# Patient Record
Sex: Female | Born: 1965 | Race: Black or African American | Hispanic: No | Marital: Married | Smoking: Never smoker
Health system: Southern US, Community
[De-identification: ages and names within clinical notes are randomized; demographics above are authoritative.]

## PROBLEM LIST (undated history)

## (undated) ENCOUNTER — Inpatient Hospital Stay (HOSPITAL_COMMUNITY): Payer: Self-pay

## (undated) DIAGNOSIS — K9189 Other postprocedural complications and disorders of digestive system: Secondary | ICD-10-CM

## (undated) DIAGNOSIS — D219 Benign neoplasm of connective and other soft tissue, unspecified: Secondary | ICD-10-CM

## (undated) DIAGNOSIS — D696 Thrombocytopenia, unspecified: Secondary | ICD-10-CM

## (undated) DIAGNOSIS — N870 Mild cervical dysplasia: Secondary | ICD-10-CM

## (undated) DIAGNOSIS — N83209 Unspecified ovarian cyst, unspecified side: Secondary | ICD-10-CM

## (undated) DIAGNOSIS — E7800 Pure hypercholesterolemia, unspecified: Secondary | ICD-10-CM

## (undated) DIAGNOSIS — K429 Umbilical hernia without obstruction or gangrene: Secondary | ICD-10-CM

## (undated) DIAGNOSIS — I10 Essential (primary) hypertension: Secondary | ICD-10-CM

## (undated) DIAGNOSIS — K567 Ileus, unspecified: Secondary | ICD-10-CM

## (undated) HISTORY — DX: Benign neoplasm of connective and other soft tissue, unspecified: D21.9

## (undated) HISTORY — PX: ABDOMINAL EXPLORATION SURGERY: SHX538

## (undated) HISTORY — PX: OTHER SURGICAL HISTORY: SHX169

## (undated) HISTORY — DX: Unspecified ovarian cyst, unspecified side: N83.209

## (undated) HISTORY — DX: Mild cervical dysplasia: N87.0

## (undated) HISTORY — PX: COLPOSCOPY: SHX161

## (undated) HISTORY — DX: Pure hypercholesterolemia, unspecified: E78.00

---

## 1997-12-14 ENCOUNTER — Other Ambulatory Visit: Admission: RE | Admit: 1997-12-14 | Discharge: 1997-12-14 | Payer: Self-pay | Admitting: Obstetrics

## 1999-03-09 ENCOUNTER — Other Ambulatory Visit: Admission: RE | Admit: 1999-03-09 | Discharge: 1999-03-09 | Payer: Self-pay | Admitting: *Deleted

## 2000-06-03 ENCOUNTER — Other Ambulatory Visit: Admission: RE | Admit: 2000-06-03 | Discharge: 2000-06-03 | Payer: Self-pay | Admitting: Obstetrics and Gynecology

## 2001-02-12 ENCOUNTER — Inpatient Hospital Stay (HOSPITAL_COMMUNITY): Admission: AD | Admit: 2001-02-12 | Discharge: 2001-02-12 | Payer: Self-pay | Admitting: *Deleted

## 2001-05-06 ENCOUNTER — Encounter: Payer: Self-pay | Admitting: *Deleted

## 2001-05-06 ENCOUNTER — Encounter (INDEPENDENT_AMBULATORY_CARE_PROVIDER_SITE_OTHER): Payer: Self-pay

## 2001-05-06 ENCOUNTER — Inpatient Hospital Stay (HOSPITAL_COMMUNITY): Admission: AD | Admit: 2001-05-06 | Discharge: 2001-05-13 | Payer: Self-pay | Admitting: *Deleted

## 2002-05-06 ENCOUNTER — Other Ambulatory Visit: Admission: RE | Admit: 2002-05-06 | Discharge: 2002-05-06 | Payer: Self-pay | Admitting: *Deleted

## 2002-06-10 ENCOUNTER — Encounter (HOSPITAL_COMMUNITY): Admission: AD | Admit: 2002-06-10 | Discharge: 2002-07-10 | Payer: Self-pay | Admitting: *Deleted

## 2002-07-15 ENCOUNTER — Encounter (HOSPITAL_COMMUNITY): Admission: AD | Admit: 2002-07-15 | Discharge: 2002-08-14 | Payer: Self-pay | Admitting: *Deleted

## 2002-08-19 ENCOUNTER — Encounter (HOSPITAL_COMMUNITY): Admission: AD | Admit: 2002-08-19 | Discharge: 2002-09-18 | Payer: Self-pay | Admitting: *Deleted

## 2002-09-23 ENCOUNTER — Encounter (HOSPITAL_COMMUNITY): Admission: RE | Admit: 2002-09-23 | Discharge: 2002-10-07 | Payer: Self-pay | Admitting: *Deleted

## 2002-10-14 ENCOUNTER — Encounter (HOSPITAL_COMMUNITY): Admission: AD | Admit: 2002-10-14 | Discharge: 2002-11-13 | Payer: Self-pay | Admitting: *Deleted

## 2002-10-18 ENCOUNTER — Inpatient Hospital Stay (HOSPITAL_COMMUNITY): Admission: AD | Admit: 2002-10-18 | Discharge: 2002-10-18 | Payer: Self-pay | Admitting: *Deleted

## 2002-11-20 ENCOUNTER — Inpatient Hospital Stay (HOSPITAL_COMMUNITY): Admission: AD | Admit: 2002-11-20 | Discharge: 2002-11-20 | Payer: Self-pay | Admitting: Obstetrics and Gynecology

## 2002-11-20 ENCOUNTER — Inpatient Hospital Stay (HOSPITAL_COMMUNITY): Admission: AD | Admit: 2002-11-20 | Discharge: 2002-11-23 | Payer: Self-pay | Admitting: *Deleted

## 2003-06-16 ENCOUNTER — Other Ambulatory Visit: Admission: RE | Admit: 2003-06-16 | Discharge: 2003-06-16 | Payer: Self-pay | Admitting: *Deleted

## 2006-01-24 ENCOUNTER — Emergency Department (HOSPITAL_COMMUNITY): Admission: EM | Admit: 2006-01-24 | Discharge: 2006-01-25 | Payer: Self-pay | Admitting: Emergency Medicine

## 2006-01-24 ENCOUNTER — Emergency Department (HOSPITAL_COMMUNITY): Admission: EM | Admit: 2006-01-24 | Discharge: 2006-01-24 | Payer: Self-pay | Admitting: Family Medicine

## 2006-03-11 ENCOUNTER — Emergency Department (HOSPITAL_COMMUNITY): Admission: EM | Admit: 2006-03-11 | Discharge: 2006-03-11 | Payer: Self-pay | Admitting: Family Medicine

## 2006-04-16 ENCOUNTER — Other Ambulatory Visit: Admission: RE | Admit: 2006-04-16 | Discharge: 2006-04-16 | Payer: Self-pay | Admitting: Obstetrics and Gynecology

## 2006-10-07 ENCOUNTER — Ambulatory Visit (HOSPITAL_COMMUNITY): Admission: RE | Admit: 2006-10-07 | Discharge: 2006-10-07 | Payer: Self-pay | Admitting: Obstetrics and Gynecology

## 2006-10-24 ENCOUNTER — Inpatient Hospital Stay (HOSPITAL_COMMUNITY): Admission: AD | Admit: 2006-10-24 | Discharge: 2006-10-27 | Payer: Self-pay | Admitting: Obstetrics and Gynecology

## 2006-10-30 ENCOUNTER — Inpatient Hospital Stay (HOSPITAL_COMMUNITY): Admission: AD | Admit: 2006-10-30 | Discharge: 2006-10-30 | Payer: Self-pay | Admitting: Obstetrics and Gynecology

## 2006-11-03 ENCOUNTER — Emergency Department (HOSPITAL_COMMUNITY): Admission: EM | Admit: 2006-11-03 | Discharge: 2006-11-03 | Payer: Self-pay | Admitting: Emergency Medicine

## 2006-11-03 ENCOUNTER — Inpatient Hospital Stay (HOSPITAL_COMMUNITY): Admission: AD | Admit: 2006-11-03 | Discharge: 2006-11-03 | Payer: Self-pay | Admitting: Obstetrics and Gynecology

## 2006-11-04 ENCOUNTER — Inpatient Hospital Stay (HOSPITAL_COMMUNITY): Admission: AD | Admit: 2006-11-04 | Discharge: 2006-11-04 | Payer: Self-pay | Admitting: Obstetrics and Gynecology

## 2006-12-08 ENCOUNTER — Inpatient Hospital Stay (HOSPITAL_COMMUNITY): Admission: AD | Admit: 2006-12-08 | Discharge: 2006-12-10 | Payer: Self-pay | Admitting: Obstetrics and Gynecology

## 2006-12-09 ENCOUNTER — Encounter: Payer: Self-pay | Admitting: Obstetrics and Gynecology

## 2006-12-19 ENCOUNTER — Ambulatory Visit (HOSPITAL_COMMUNITY): Admission: RE | Admit: 2006-12-19 | Discharge: 2006-12-19 | Payer: Self-pay | Admitting: Obstetrics and Gynecology

## 2007-01-02 ENCOUNTER — Encounter (INDEPENDENT_AMBULATORY_CARE_PROVIDER_SITE_OTHER): Payer: Self-pay | Admitting: Specialist

## 2007-01-02 ENCOUNTER — Inpatient Hospital Stay (HOSPITAL_COMMUNITY): Admission: AD | Admit: 2007-01-02 | Discharge: 2007-01-06 | Payer: Self-pay | Admitting: Obstetrics and Gynecology

## 2007-02-13 ENCOUNTER — Other Ambulatory Visit: Admission: RE | Admit: 2007-02-13 | Discharge: 2007-02-13 | Payer: Self-pay | Admitting: Obstetrics and Gynecology

## 2008-01-16 ENCOUNTER — Encounter: Admission: RE | Admit: 2008-01-16 | Discharge: 2008-01-16 | Payer: Self-pay | Admitting: Family Medicine

## 2008-01-27 ENCOUNTER — Encounter: Admission: RE | Admit: 2008-01-27 | Discharge: 2008-01-27 | Payer: Self-pay | Admitting: Family Medicine

## 2008-05-25 ENCOUNTER — Encounter: Payer: Self-pay | Admitting: Obstetrics and Gynecology

## 2008-05-25 ENCOUNTER — Ambulatory Visit: Payer: Self-pay | Admitting: Obstetrics and Gynecology

## 2008-05-25 ENCOUNTER — Other Ambulatory Visit: Admission: RE | Admit: 2008-05-25 | Discharge: 2008-05-25 | Payer: Self-pay | Admitting: Obstetrics and Gynecology

## 2008-06-23 ENCOUNTER — Ambulatory Visit: Payer: Self-pay | Admitting: Obstetrics and Gynecology

## 2009-03-23 ENCOUNTER — Other Ambulatory Visit: Admission: RE | Admit: 2009-03-23 | Discharge: 2009-03-23 | Payer: Self-pay | Admitting: Obstetrics and Gynecology

## 2009-03-23 ENCOUNTER — Ambulatory Visit: Payer: Self-pay | Admitting: Obstetrics and Gynecology

## 2009-03-23 ENCOUNTER — Encounter: Payer: Self-pay | Admitting: Obstetrics and Gynecology

## 2009-09-05 ENCOUNTER — Ambulatory Visit: Payer: Self-pay | Admitting: Obstetrics and Gynecology

## 2009-09-08 ENCOUNTER — Ambulatory Visit: Payer: Self-pay | Admitting: Obstetrics and Gynecology

## 2010-06-14 ENCOUNTER — Other Ambulatory Visit: Admission: RE | Admit: 2010-06-14 | Discharge: 2010-06-14 | Payer: Self-pay | Admitting: Obstetrics and Gynecology

## 2010-06-14 ENCOUNTER — Ambulatory Visit: Payer: Self-pay | Admitting: Obstetrics and Gynecology

## 2010-07-05 ENCOUNTER — Encounter: Admission: RE | Admit: 2010-07-05 | Discharge: 2010-07-05 | Payer: Self-pay | Admitting: Neurology

## 2011-02-09 NOTE — Discharge Summary (Signed)
   NAMEMCKENLEE, MANGHAM                          ACCOUNT NO.:  [id number]   MEDICAL RECORD NO.:  [id number]                   PATIENT TYPE:  INP   LOCATION:  9111                                 FACILITY:  [facility]   PHYSICIAN:  Georgina Peer, M.D.              DATE OF BIRTH:  [birth year]   DATE OF ADMISSION:  11/20/2002  DATE OF DISCHARGE:  11/23/2002                                 DISCHARGE SUMMARY   ADMISSION DIAGNOSES:  1. Intrauterine pregnancy at 38 weeks.  2. Previous classical cesarean section, breech presentation.  3. Posterior placenta previa.  4. Subserosal fibroid.   DISCHARGE DIAGNOSES:  1. Intrauterine pregnancy at 38 weeks.  2. Previous classical cesarean section, breech presentation.  3. Posterior placenta previa.  4. Subserosal fibroid.  5. Complicated anemia.   HOSPITAL COURSE:  This is a 45 year old, G4, P0-1-2-0 who presented for  elective repeat cesarean section at 38 weeks.  She had an elective repeat  low transverse cesarean section with a blood loss of 800 cc with delivery of  a 6 pound 15 ounce female in breech presentation delivered with Apgar's of 7  and 8.  There was a posterior fibroid noted.  Postoperatively, the patient  did well.  Diastolics were running in the 70s-80s.  She remained afebrile.  Postoperative hemoglobin was 11.5 and 11.4.  Her wound was clean and dry.  She passed gas by the second day.  She was advanced to a regular diet.  She  was switched to p.o. Percocet 5/325 and Motrin 600 mg q.6h.  She was deemed  ready for discharge on November 23, 2002.   CONDITION ON DISCHARGE:  Good.   DISCHARGE MEDICATIONS:  1. Percocet 5/325 mg, #60.  2. Ibuprofen at home.   SPECIAL INSTRUCTIONS:  She was given discharge booklet with instructions.   FOLLOW UP:  Return to the office in two weeks.                                                Georgina Peer, M.D.    JPN/MEDQ  D:  11/23/2002  T:  11/23/2002  Job:  [id number]

## 2011-02-09 NOTE — H&P (Signed)
[name], [name]                ACCOUNT NO.:  [id number]   MEDICAL RECORD NO.:  [id number]          PATIENT TYPE:  OBV   LOCATION:  9172                          [facility]  [facility]   PHYSICIAN:  Rudy Jew. Ashley Royalty, M.D.DATE OF BIRTH:  07-27-1966   DATE OF ADMISSION:  01/01/2007  DATE OF DISCHARGE:                              HISTORY & PHYSICAL   This is a 45 year old gravida 5, para 1-1-2-2, [facility] December 28, 2006,  currently approximately 36 weeks' 2 days' gestation.  Prenatal care has  been complicated by a host of risk factors including previous cesarean  section x2, advanced maternal age (the patient declined second trimester  screen and amniocentesis), placenta previa (marginal), fibroid uterus,  history of intrauterine fetal demise, history of preeclampsia, and  chronic hypertension.  She presented this evening complaining of  contractions.  She was evaluated in maternity admissions and noted to be  having occasional contractions.  She was also noted to have  hypertension.  Initial blood pressure was 155/89.  Blood pressures  continued in that range to up to a maximum of approximately 179/93 and  down as low as 121/74.  The patient's contractions were initially at  least every 5 minutes, and she was given IV hydration as well as a  single dose of subcutaneous terbutaline which essentially resolved the  contractions at least transiently.  She denied any bleeding.  She does  have a dull headache which is not uncommon for her during this  pregnancy.  She denies any visual disturbances, right upper quadrant or  epigastric pain.  For remainder of the past medical history, please see  [place] form.   PHYSICAL EXAMINATION:  Well-developed, well-nourished, diminutive black  female in no acute distress, afebrile.  Blood pressures as above.  Vital  Signs stable.  CHEST:  Lungs are clear.  CARDIAC:  Regular rate and rhythm.  ABDOMEN:  Soft and nontender.  Contractions as above.  The fundal  height  was approximately 36.  Fetal heart tones are auscultated, and the  tracing is reactive.  PELVIC EXAMINATION:  Was deferred.   IMPRESSION:  1. Intrauterine pregnancy at 36 weeks 2 days' gestation.  2. Previous cesarean section x2.  3. Advanced maternal age.  4. Chronic hypertension.  5. History of severe preeclampsia.  6. History of intrauterine fetal demise.  7. Fibroid uterus.  8. Placenta previa  9. Preterm contractions - currently lysed with IV hydration and 1 dose      of terbutaline.   PLAN:  The patient is to be placed on outpatient observation.  It should  be mentioned that a PIH panel was obtained and is entirely negative.  Urinalysis was obtained as well and revealed no proteinuria.  We will  carefully observe the patient's blood pressure as well as her  contraction pattern.  Will make her n.p.o. in case unscheduled cesarean  section is deemed necessary.      James A. Ashley Royalty, M.D.  Electronically Signed     JAM/MEDQ  D:  01/02/2007  T:  01/02/2007  Job:  [id number]

## 2011-02-09 NOTE — Discharge Summary (Signed)
[name], [name]                ACCOUNT NO.:  [id number]   MEDICAL RECORD NO.:  [id number]          PATIENT TYPE:  INP   LOCATION:  9155                          [facility]   PHYSICIAN:  James A. Ashley Royalty, M.D.DATE OF BIRTH:  [birth year]   DATE OF ADMISSION:  12/08/2006  DATE OF DISCHARGE:  12/10/2006                               DISCHARGE SUMMARY   DISCHARGE DIAGNOSES:  1. Intrauterine pregnancy at 38 weeks' gestation.  2. Chronic hypertension.  3. History of severe preeclampsia.  4. Marginal placenta previa  5. Previous cesarean section x2.  6. History of intrauterine fetal demise.  7. Fibroid uterus.  8. Advanced maternal age.  9. No current evidence of preeclampsia.   OPERATIONS AND PROCEDURES:  None.   DISCHARGE MEDICATIONS:  1. Prenatal vitamins.  2. Labetalol 200 mg in the evening, 100 mg in the morning.   HISTORY AND PHYSICAL:  This is a 40-year gravida 5, para 1-1-2-2 at 12  weeks' gestation upon admission.  Prenatal care was complicated by the  aforementioned diagnoses.  The patient presented to maternity admissions  complaining of diminished fetal movement.  Blood pressure was noted be  160/110.  The patient was admitted for bedrest and diagnostic studies  and consideration for delivery versus further expectant management.  For  remainder of the history and physical, please see chart.   HOSPITAL COURSE:  The patient was admitted to [facility]  [place].  Initial laboratory studies were drawn.  A 24-hour urine was  initiated.  Maternal fetal medicine consultation was obtained.  A 24-  hour urine result revealed below detectable range.  The patient's  blood pressure was excellent during the hospitalization.  She was found  to have no current evidence of preeclampsia.  She was felt to be stable  for discharge on December 10, 2006 and was discharged home afebrile and in  satisfactory condition.   DISPOSITION:  The patient is to return to the office in  2 days.      James A. Ashley Royalty, M.D.  Electronically Signed     JAM/MEDQ  D:  02/19/2007  T:  02/19/2007  Job:  [id number]

## 2011-02-09 NOTE — Op Note (Signed)
[name], [name]                ACCOUNT NO.:  [id number]   MEDICAL RECORD NO.:  [id number]          PATIENT TYPE:  INP   LOCATION:  9142                          [facility]   PHYSICIAN:  Rudy Jew. Ashley Royalty, M.D.DATE OF BIRTH:  1966/01/11   DATE OF PROCEDURE:  01/02/2007  DATE OF DISCHARGE:                               OPERATIVE REPORT   PREOPERATIVE DIAGNOSES:  1. Intrauterine pregnancy at 36 weeks 4 days' gestation.  2. Previous cesarean section x2.  3. Chronic hypertension.  4. Placenta previa.  5. Fibroid uterus.  6. Preterm labor.   POSTOPERATIVE DIAGNOSES:  1. Intrauterine pregnancy at 36 weeks 4 days' gestation.  2. Previous cesarean section x2.  3. Chronic hypertension.  4. Placenta previa.  5. Fibroid uterus.  6. Preterm labor.   PROCEDURE:  Repeat low transverse cesarean section.   SURGEON:  Rudy Jew. Ashley Royalty, M.D.   ANESTHESIA:  Spinal.   FINDINGS:  A 5 pound 3 ounce female, Apgars 7 at one minute, 8 at five  minutes.  Approximately 4 cm posterior fibroid of the inferior aspect of  the uterine corpus.   ESTIMATED BLOOD LOSS:  800 mL.   COMPLICATIONS:  None.   PACKS AND DRAINS:  Foley.   SPONGE, NEEDLE AND INSTRUMENT COUNT:  Reported as correct x2.   PROCEDURE:  The patient was taken to the operating room and placed in  the sitting position.  After a spinal anesthetic was administered, she  was placed in the dorsal supine position and prepped and draped in the  usual manner for abdominal surgery.  A Foley catheter was placed.  A  Pfannenstiel incision was made down to the level of the fascia, which  was nicked with a knife, incised transversely with Mayo scissors.  The  underlying rectus muscles were separated from the fascia using sharp and  blunt dissection.  The uterus was identified and some adhesions from the  anterior abdominal wall to the anterior aspect of the uterus were  encountered and lysed carefully.  The uterus was noted to be  scarred  inferiorly and it was difficult to create a reasonable bladder flap.  However, every effort was made to reflect a bladder flap inferiorly.  The uterus was then entered through a low transverse incision using  sharp and blunt dissection.  The fluid was noted to be clear.  It was  difficult to gain entry into the uterine cavity due to the fibroid,  which considerably distorted the cavity.  The infant was delivered from  a frank breech presentation in an atraumatic manner.  The infant was  suctioned.  The cord was triply clamped, cut, and the infant given  immediately to the waiting pediatrics team.  Arterial cord pH was  obtained from an isolated segment and the value was 7.22.  Regular cord  blood was to be obtained by the umbilical cord donation group.  The  placenta and membranes were removed in their entirety and submitted to  pathology for histologic studies.  The uterus was exteriorized.  The  uterus was then closed in two running layers  with #1 Vicryl.  The first  was a running locking layer.  The second was a running, intermittently  locking and imbricating layer.  Two additional figure-of-eight sutures  of 2-0 Vicryl were required to obtain hemostasis.  Hemostasis was noted.  The uterus, tubes and ovaries were inspected and found to be otherwise  normal.  They were returned to the abdominal cavity.  Copious irrigation  was accomplished.  Hemostasis was noted.  The peritoneum was then closed  using 3-0 Vicryl in a running fashion.  The fascia was closed 0 Vicryl  in a running fashion.  The skin was closed staples.  At the conclusion  of the procedure, the urine was clear and copious.      James A. Ashley Royalty, M.D.  Electronically Signed     JAM/MEDQ  D:  01/02/2007  T:  01/03/2007  Job:  [id number]

## 2011-02-09 NOTE — H&P (Signed)
[facility]  Patient:    [name], [name]                         MRN: [id number] Adm. Date:  05/06/01 Dictator:   Georgina Peer, M.D.                         History and Physical  ADMISSION DIAGNOSES:          1. Intrauterine pregnancy at 23-3/7ths weeks.                               2. Lower back pain, vaginal bleeding,                                  hypertension to rule out preeclampsia.                               3. Fibroid uterus.  HISTORY OF PRESENT ILLNESS:   The patient is a 45 year old gravida 3, para 0-0-2-0 with an EDC of August 28, 2001.  The patient had complained of a headache two days ago which resolved and some lower back pain.  She began having red vaginal bleeding and cramping early this morning of May 06, 2001.  She was evaluated in maternity admissions and found to be contracting every 3-4 minutes.  Initial blood pressure showed diastolic initially of 116 and then down to 108.  The patient denied headache and blurred vision, epigastric or right upper quadrant pain.  She denied fevers or chills.  She had had a normal bowel movement one day previous.  She had no urinary complaints.  The baby had been active, and she denied any excessive swelling. The patient is admitted for evaluation and treatment of preterm labor and elevated blood pressure.  PRENATAL HISTORY:             The patient had an uncomplicated prenatal course; fibroid making her size greater than dates, but an ultrasound confirmed dates.  She had no hypertension, no bleeding.  PAST MEDICAL HISTORY:         Significant for one elective abortion, one spontaneous miscarriage.  The patient has no heart, lung or kidney disease. 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  ALLERGIES:                    She has no known drug allergies.  MEDICATIONS:                  She is taking prenatal vitamins.  PRENATAL LABORATORY DATA:     Her  laboratory values showed A positive, antibody negative, VDRL nonreactive, rubella immune, HBsAg negative, HIV nonreactive.  Initial platelet count with her prenatal labs were 254,000 and hemoglobin 11.8 in May 2002.  SOCIAL HISTORY:               No smoking, alcohol or recreational drug use was noted.  REVIEW OF SYSTEMS:            Otherwise negative.  PAST FAMILY HISTORY:          Noncontributory.  PHYSICAL EXAMINATION:  GENERAL:  Shows a well-developed black female in no acute distress, alert and oriented.  NECK:                         Supple with no JVD, thyroid normal.  HEENT:                        Normal.  CHEST:                        Clear.  HEART:                        Regular rate and rhythm.  BREASTS:                      Not examined.  ABDOMEN:                      Fundal height is 26 cm, soft, nontender.  No CVA tenderness.  EXTREMITIES:                  With trace edema and normal reflexes 2+/4+ with no clonus.  PELVIC:                       Fetal heart tones were obtained at 150. Contractions were noted on the monitor every 3-4 minutes which were mild and lasting approximately 40 seconds.  Pelvic exam revealed a posterior mass displacing the cervix anteriorly to the point where it could not be examined digitally.  Ultrasound in MAU confirmed a viable pregnancy which was transverse in position with the head on the left side and subjectively normal amniotic fluid.  LABORATORY VALUES:            Hemoglobin was 11.1, platelet count 199,000. Urine is negative for protein and completely negative on a catheterized specimen.  Creatinine is 0.6, SGOT 56 which is elevated with a high normal at 37, SGPT of 17.  The patients LDH was 475 which was elevated.  Uric acid was 3.1 which was normal.  ADMISSION DIAGNOSES:          1. A 23-week intrauterine pregnancy.                               2. Hypertension.                               3.  Preterm labor.  PLAN:                         The patient will be placed on strict bed rest. Magnesium sulfate will be administered, a 4 g bolus and 2 g an hour.  She will be watched carefully.  Hypertensive response will be monitored closely and antihypertensives given if needed.  She will have an ultrasound performed to assess the fibroid and assess fetal growth and placental positioning and repeat magnesium level will be obtained in six hours. DD:  05/06/01 TD:  05/06/01 Job: [id number]

## 2011-02-09 NOTE — Consult Note (Signed)
[name], [name]                ACCOUNT NO.:  [id number]   MEDICAL RECORD NO.:  [id number]          PATIENT TYPE:  INP   LOCATION:  9155                          [facility]ACILITY[facility]  [facility]   PHYSICIAN:  Michael L. Reynolds, M.D.DATE OF BIRTH:  Jan 23, 1966   DATE OF CONSULTATION:  10/26/2006  DATE OF DISCHARGE:                                 CONSULTATION   REQUESTING PHYSICIAN:  Rudy Jew. Ashley Royalty, M.D.   CONSULTING PHYSICIAN:  Marolyn Hammock. Thad Ranger, M.D.   REASON FOR EVALUATION:  Headache.   HISTORY OF PRESENT ILLNESS:  This is the initial inpatient consultation  evaluation of this 45 year old woman with a past medical history which  includes migraine who was admitted to [facility] on October 24, 2006, with a headache and concerns regarding development of pre-  eclampsia.  She has been followed for some elevated blood pressures and  has recently been started on labetalol.  She is admitted for  observation.  Her blood pressure has been under better control at this  time.  However, she complains of a headache which has been fairly  persistent for the last couple of weeks.  She says it was very severe on  Thursday, but has been present about daily, although no constantly over  that period of time.  When very severe, it is associated with nausea and  vomiting.  The pain is on the left side of the head and is persistent,  dull, sometimes with a throbbing component.  It is not really associated  with photophobia or phonophobia.  She states that her remote migraines  were also unilateral, but they typically were more severe and throbbing  in character.  She has been taking Tylenol at home for the headaches and  here in the hospital she is receiving some Tylenol and some Darvocet.  Neurologic consultation was requested for further considerations.   PAST MEDICAL HISTORY:  1. Remote history of migraine as above.  2. Pregnancy associated hypertension as above.  Beyond that she denies any  chronic medical problems.   FAMILY HISTORY:  Remarkable for migraine in her mother.   REVIEW OF SYSTEMS:  Per HPI and admission H&P.   MEDICATIONS:  1. Labetalol 200 mg q.p.m.  2. Tylenol p.r.n.  3. Darvocet as above.  4. Phenergan p.r.n.   PHYSICAL EXAMINATION:  VITAL SIGNS:  Afebrile, blood pressure 130 to 140  over 70 to 80, pulse 60, respirations 16.  GENERAL:  This is a healthy-appearing woman.  I see no evident distress.  HEAD:  Cranium is normocephalic and atraumatic.  There is tenderness to  palpation over the left temporal area but not really over the neck.  THROAT:  Oropharynx is benign.  NECK:  Supple without carotid or supraclavicular bruits.  There is  minimal spasm of the neck.  NEUROLOGIC:  Mental status:  She is awake and alert, fully oriented.  She is able to name objects and repeat phrases without difficulty.  Mood  is euthymic and affect appropriate.  Cranial nerves:  Pupils are equal  and reactive.  Extraocular movements full without nystagmus.  Visual  fields full to confrontation.  Hearing is intact to conversational  speech.  Facial sensation intact to pinprick.  Face, tongue, and palate  move normally and symmetrically.  Motor:  Normal bulk and tone.  Normal  strength in all tested extremity muscles.  Sensation intact to light  touch and double simultaneous stimulation all extremities.  Coordination:  Rapid movements performed adequately.  Finger-to-nose  performed adequately.  Gait, she arises easily from a chair and stance  is normal.  She is able to tandem stand and perform a Romberg maneuver  without difficulty.  Reflexes 2+ and symmetric.  Toes are downgoing  bilaterally.   LABORATORY REVIEW:  Labs per daily notes.  Particular note is made of a  normal white count of 4.9, hemoglobin of 11.4.  Normal C-MET except for  some slightly low total protein and albumin.  She has had no neuro  imaging.   IMPRESSION:  Transformed migraine in a patient with  premorbid history,  pregnancy, and suspected pre-eclampsia.   RECOMMENDATIONS:  K-Pad  to the neck would be helpful.  She would  benefit from a course prophylaxis which should begin with magnesium  oxide 200-400 mg b.i.d. and riboflavin 50-100 mg b.i.d., and if not  helpful can proceed to a low dose of a tricyclic agent or Flexeril.  For  acute headaches they should continue the Tylenol and Darvocet, although  these might be leading to some extent to a rebound headache.  Therefore,  a prednisone taper if felt to be safe would probably be very helpful.   Thanks for the consultation.      Michael L. Thad Ranger, M.D.  Electronically Signed     MLR/MEDQ  D:  10/26/2006  T:  10/26/2006  Job:  [id number]

## 2011-02-09 NOTE — Discharge Summary (Signed)
[facility]  Patient:    [name], [name] Visit Number: [id number] MRN: [id number]          Service Type: OBS Location: [id number] Attending Physician:  Pleas Koch Dictated by:   Georgina Peer, M.D. Admit Date:  05/06/2001 Discharge Date: 05/13/2001                             Discharge Summary  ADMISSION DIAGNOSES:          1. Pregnancy at 23 weeks.                               2. Hypertension.                               3. Vaginal bleeding.                               4. Preterm labor.                               5. Fibroid uterus.  DISCHARGE DIAGNOSES:          1. Pregnancy at 23 weeks.                               2. Placental abruption.                               3. Intrauterine fetal demise.                               4. Hypertension.                               5. Fibroid uterus.                               6. Anemia.                               7. Preeclampsia, severe.  BRIEF HISTORY:                This is a 45 year old, gravida 3, para 0-0-2-0, new patient who was evaluated on August 13 at 23-3/7 weeks with abdominal pain, vaginal bleeding which began on the morning of presentation. She was noted to have uterine contractions and had positive fetal movement. A large posterior fibroid was noted which had previously been identified. The patients urine protein was negative. Hemoglobin was 11.1, platelets 199,000.  HOSPITAL COURSE:              She was admitted for magnesium sulfate which was commenced. The patient continued to have tenderness, a firm abdomen, and received pain medication. The patient had a group B strep done and was place on Unasyn. PIH labs were obtained which were normal, and blood pressure was 160s/90s. Fibrinogen was 193. D-dimer was greater than 20. Creatinine  was 0.6. SGOT and SGPT were both high normal. The heart tones were unable to be obtained on the evening of August 13, and  diagnosis of intrauterine fetal demise, thought secondary to abruption, was made. The patient was stable at that time and was not spontaneously bleeding. She continued on magnesium sulfate. Cytotec was placed in an attempt to mature the cervix, however, multiple doses of Cytotec up to 200 mcg was used without effective change in the cervix. The patients hemoglobin continued to drop, presumably from abruption. Platelet count was also dropping, and it was decided, for maternal reasons, to deliver this patient. Therefore, on August 14 she underwent a classical cesarean section for a nonviable infant. There was blood and blood clot in the uterine cavity and a large posterior fibroid.  The patient was maintained on magnesium sulfate postoperatively and her hemoglobin was 6.4 postoperatively, from 7.7 preoperatively. Platelets 126,000 from 147,000. The patient was passing gas on the second day. Hemoglobin was 5.4, platelet count 157,000. I&Os were adequate. The patients magnesium sulfate was weaned. The patients blood pressure increased and the patient was restarted on magnesium sulfate on August 18 for a flare-up. Blood pressures were controlled. She was placed on increased labetalol. The patient then was weaned back off the magnesium sulfate, and by the end of August 19, was off magnesium sulfate, hemoglobin was up to 6.5, and the patient was not symptomatic. Magnesium sulfate was decreased on the morning of August 20. Labetalol was increased to 200 mg t.i.d. with blood pressures running 140-160/70-90. On the evening of August 20, the patient desired to go home. Her staples were removed and incision was intact. She was afebrile.  DISCHARGE MEDICATIONS:        She was discharged on labetalol 200 mg t.i.d., Percocet, and Chromagen.  DISCHARGE FOLLOWUP:           She will follow up in one week in the office.  DISCHARGE INSTRUCTIONS:       She was given detailed instructions as far as calling  for fever, incisional problems, bleeding, headache or blurred vision. ictated by:   Georgina Peer, M.D. Attending Physician:  Pleas Koch DD:  06/09/01 TD:  06/09/01 Job: [id number]

## 2011-02-09 NOTE — H&P (Signed)
[name], [name]                          ACCOUNT NO.:  [id number]   MEDICAL RECORD NO.:  [id number]                   PATIENT TYPE:  INP   LOCATION:  NA                                   [facility]ILITY[facility]   PHYSICIAN:  Georgina Peer, M.D.              DATE OF BIRTH:  1966-01-17   DATE OF ADMISSION:  11/20/2002  DATE OF DISCHARGE:                                HISTORY & PHYSICAL   ADMISSION DIAGNOSES:  Previous classical cesarean section, Pregnancy  38+weeks, placenta previa and pregnancy induced hypertension.   BRIEF HISTORY:  This is a 45 year old gravida 4, para 0, 1, 2, 0, EDC  12/04/02 with a known placenta previa posteriorly and a known posterior  fibroid.  The patient has had a previous classical cesarean section for abruption and  is brought in for repeat cesarean section. She has been followed for  pregnancy induced hypertension and has had no bleeding from the placenta  previa. The infant has shown good growth with normal fluid.  The patient had  an amniocentesis on 2/18 showing LF at 1.6 PG negative and therefore, a 9  days' weight before cesarean section was undertaken.   Prenatal course:  The patient was diagnosed with a placenta previa in the  second trimester.  They had not moved.  The placenta was implanted over a  posterior fibroid and because of the previous abruption, the patient had  been receiving hydroxyprogesterone caprylate 250 mg IM daily. She had been  having regular nonstress tests and ultrasounds showed good growth.  The  patient did not have gestational diabetes. She developed elevated blood  pressures and was on bed rest, and followed closely from 32 weeks, and has  done well.   LABORATORY DATA:  Blood type A positive, antibody negative, sickle negative,  VDRL nonreactive, Rubella positive, HBASG negative, glucose tolerance test  115, MSAFP normal, amnio declined, GC/Chlamydia also negative.   PAST MEDICAL HISTORY:  History of classical  cesarean section in 8/02 for an  abruption with fetal demise, failed induction vaginally because of an  obstructing posterior fibroid.  Previous spontaneous pregnancy, previous  elective pregnancy termination.  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  The patient has been on iron.   FAMILY HISTORY:  Not significant.   SOCIAL HISTORY:  Patient is a nonsmoker, no alcohol or recreational drugs.   ALLERGIES:  NO KNOWN DRUG ALLERGIES.   MEDICATIONS:  Iron, prenatal vitamins, terbutaline 2.5 mg q.4h.   PHYSICAL EXAMINATION:  VITAL SIGNS:  Blood pressure 120/80.  HEENT:  Normal.  LUNGS:  Clear.  CARDIOVASCULAR:  Regular rate and rhythm.  ABDOMEN:  Fundal height of 36 cm with tenderness in the right upper quadrant  where the infant's head is. There is no other abdominal tenderness.  Abdomen  gravid with fetal heart tones obtained in a normal range.  EXTREMITIES:  Trace edema, normal reflexes, 2+/4+ with no clonus.  PELVIC:  Deferred because of placenta previa and risks of bleeding.   IMPRESSION ON ADMISSION:  1. Previous cesarean section, classical.  2. Pregnancy induced hypertension.  3. Placenta previa and fibroid uterus.   PLAN:  Will proceed with elective repeat cesarean section, low transverse,  if possible.  Patient is aware that because of the placenta previa and  posterior fibroid,there may be an increased risk of bleeding which may  require blood transfusion and/or cesarean hysterectomy. She was counseled on  the risks of blood transfusion and cesarean hysterectomy, as well as the  risks of a cesarean section including bleeding, infection, damage to pelvic  organs, increased risks of blood clots. She is willing to proceed, and will  proceed 2/27 at 9 a.m. in this hospital.                                               Georgina Peer, M.D.    JPN/MEDQ  D:  11/19/2002  T:  11/19/2002  Job:  [id number]   cc:   Day Surgery, [facility]

## 2011-02-09 NOTE — Op Note (Signed)
[facility]  Patient:    [name], [name]                         MRN: [id number] Proc. Date: 05/07/01 Adm. Date:  [id number] Attending:  Pleas Koch                           Operative Report  PREOPERATIVE DIAGNOSES:       1. Intrauterine pregnancy at 23 weeks.                               2. Abruption placentae.                               3. Preeclampsia.                               4. Fibroid uterus.                               5. Anemia.                               6. Thrombocytopenia.                               7. Intrauterine fetal demise.  POSTOPERATIVE DIAGNOSES:      1. Intrauterine pregnancy at 23 weeks.                               2. Abruption placentae.                               3. Preeclampsia.                               4. Fibroid uterus.                               5. Anemia.                               6. Thrombocytopenia.                               7. Intrauterine fetal demise.  OPERATION:                    Classical cesarean section.  SURGEON:                      Georgina Peer, M.D.  ASSISTANT:                    Rudy Jew. Ashley Royalty, M.D.  ANESTHESIA:                   Epidural.  ESTIMATED BLOOD LOSS:  1000 cc, including 400 cc of clotted and non-clotted blood in the uterine cavity.  COMPLICATIONS:                 None.  FINDINGS:                     Distended uterine cavity with blood and clot. Nonviable female fetus delivered at 2100 with Apgars of 0 and 0. Large posterior fibroid uterus. Normal tubes and ovaries.  INDICATIONS:                  This is a 45 year old African-American female, gravida 3, para 0-0-2-0, [facility] August 28, 2001 presented with an elevated blood pressure, vaginal bleeding, and cramping on August 13. The patient had an ultrasound which was suggestive of placental abruption. She was placed on magnesium sulfate. The fetus was appropriately grown and  initially had fetal heart rates in the 140s and 150s. Fetal heart tones were not able to be obtained on August 13 at approximately 1935 and fetal demise was confirmed with ultrasound. The patients blood pressure was ranging with diastolics from 93-100 and the patient was having a tender uterus. It was felt that the abruption had progressed and that that caused the demise. Magnesium sulfate was continued. Attempts at vaginal delivery using Cytotec and Pitocin were unsuccessful with dilating the cervix, and with the patients hemoglobin progressively decreasing to a low of 7.7, from initially at 10.9, and platelet count decreasing from 200,000 to 145,000, it was thought that delivery would be in the best interest of the patient. The patient and husband agreed. Fibrinogen and platelet counts were stable and after informed consent, the patient was taken to the OR. She had had an epidural placed during the labor process and a Foley catheter placed.  DESCRIPTION OF PROCEDURE:     Epidural was adequately dosed and the patient was prepped and draped in the normal sterile fashion. A transverse incision was made in the skin, taken down into the fascia in the normal Pfannenstiel manner. A distended thin walled uterus was noted. Bladder flap was created and low vertical incision was made in the uterus and extended up toward the fundus. The myometrium was saccular and blood and blood clot egressed from the incision in the uterine cavity around the gestational sac. The membranes were ruptured yielding clear fluid and at 2100 a nonviable female infant was delivered in the breech presentation with Apgars of 0 and 0. Cord blood was unable to be obtained. Placenta was removed and was posterior. The uterus contracted well. There was no extension of the incision. It was closed in two layers of Vicryl. The second imbricating layer of Vicryl. Interrupted figure-of-eight sutures of Vicryl controlled bleeding  along the incisional lines. Tubes and ovaries were inspected and found to be normal. The posterior fibroid was identified, did not appear to be adherent to any structures but did seem contained in the bony pelvis. Irrigation removed both blood and blood clot. Pitocin was added to the IV. The muscles and fascia were cauterized for any bleeders. The fascia was closed with Vicryl sutures. The subcutaneous tissue was cauterized for bleeders and the skin closed with skin staples. Sponge, needle, and instrument counts were correct and the patient returned to recovery area in stable condition to be restarted on magnesium sulfate and watched carefully for worsening signs of DIC and/or preeclampsia. DD:  05/07/01 TD:  05/07/01 Job: [id number]

## 2011-02-09 NOTE — Discharge Summary (Signed)
[name], [name]                ACCOUNT NO.:  [id number]   MEDICAL RECORD NO.:  [id number]          PATIENT TYPE:  INP   LOCATION:  9105                          [facility]  [facility]   PHYSICIAN:  Rudy Jew. Ashley Royalty, M.D.DATE OF BIRTH:  [birth year]   DATE OF ADMISSION:  01/01/2007  DATE OF DISCHARGE:  01/06/2007                               DISCHARGE SUMMARY   DISCHARGE DIAGNOSES:  1. Intrauterine pregnancy at the 36 weeks 4 days gestation, delivered.  2. Previous cesarean section.  3. Chronic hypertension.  4. Placenta previa.  5. Fibroid uterus.  6. Preterm labor.  7. Preeclampsia.   OPERATIONS AND PROCEDURES:  Repeat low transverse cesarean section.   CONSULTATIONS:  None.   DISCHARGE MEDICATIONS:  1. Labetalol 200 mg p.o. b.i.d.  2. Hydrochlorothiazide 25 mg daily.  3. Percocet.  4. Motrin 600 mg.   HISTORY AND PHYSICAL:  This is a 40-year gravida 5, para 1-1-2-2, with  the aforementioned diagnoses.  The patient presented to the office on  the evening of admission complaining of contractions.  Initial blood  pressure was 155/89.  The patient was placed on outpatient observation  after being given treatment for uterine contractions in maternity  admissions. For the remainder of the H&P please see chart.   Hosipital Course  Patient was subsequentlly admitted. On January 02, 2007, she was noted be  having occasional contractions.  She was diagnosed with preeclampsia and  the decision was subsequently made to deliver. She was hence taken to  the operationg room for repeat low transverse cesarean section on January 02, 2007. The infant was a 5 pound 3 ounce female, Apgars 7 at one minute,  8 at five minutes sent to the newborn nursery.  Delivery was  accomplished by Dr. Sylvester Harder with a spinal anesthetic.  On January 03, 2007, the patient was noted to have slightly elevated liver enzymes  with SGOT of 58 and SGPT of 25.  PIH panel was obtained, and the SGOT  was noted be  elevated.  The patient was hence transferred to the  intensive care unit and received magnesium sulfate.  On January 05, 2007,  she was noted to have a declining SGOT and stable vital signs.  On January 06, 2007, she was felt to be stable for discharge and was discharged  home afebrile and in satisfactory condition on the aforementioned  medications.   DISPOSITION:  The patient is to return to [facility]  Ob[facility]s in 3 days for further evaluation and therapy.      James A. Ashley Royalty, M.D.  Electronically Signed     JAM/MEDQ  D:  02/19/2007  T:  02/19/2007  Job:  [id number]

## 2011-02-09 NOTE — Op Note (Signed)
[name], [name]                          ACCOUNT NO.:  [id number]   MEDICAL RECORD NO.:  [id number]                   PATIENT TYPE:  INP   LOCATION:  9198                                 [facility]  [facility]   PHYSICIAN:  Georgina Peer, M.D.              DATE OF BIRTH:  08-30-1966   DATE OF PROCEDURE:  11/20/2002  DATE OF DISCHARGE:                                 OPERATIVE REPORT   PREOPERATIVE DIAGNOSES:  1. Intrauterine pregnancy 38 weeks.  2. Previous classical cesarean section.  3. Posterior placenta previa.  4. Breech presentation.   POSTOPERATIVE DIAGNOSES:  1. Intrauterine pregnancy 38 weeks.  2. Previous classical cesarean section.  3. Posterior placenta previa.  4. Breech presentation.   OPERATION PERFORMED:  Repeat low transverse cesarean section.   SURGEON:  Georgina Peer, M.D.   ASSISTANT:  Ronda Fairly. Galen Daft, M.D.   ANESTHESIA:  Spinal, Burnett Corrente, M.D.   ESTIMATED BLOOD LOSS:  800 mL.   FINDINGS:  A 6 pound 15 ounce female born footling breech at 9:42 a.m.  Apgars 7 and 8.  Cord pH 7.29 arterial.  Large posterior fibroid and  posterior placenta previa noted.   INDICATIONS:  A 45 year old gravida 4, para 0-1-2-0, [facility] December 04, 2002  presented at 38 weeks for elective repeat cesarean section due to previous  classical cesarean section.  Large posterior fibroid and placenta previa.  Please see history and physical for details.   DESCRIPTION OF PROCEDURE:  After informed consent the patient was taken to  the operating room, given a spinal by Burnett Corrente, M.D.  After adequate  anesthesia she was prepped and draped in a normal sterile fashion.  Foley  catheter placed in her bladder.  Transverse skin incision excising the  previous keloid scar was made and then the dissection taken down through the  fascia and the rectus muscles were separated.  The peritoneum was opened.  A  transverse bladder flap was created.  A transverse uterine incision was  made  and membranes were ruptured yielding clear fluid.  At 9:42 an infant female  was delivered.  The footling breech was encountered.  One foot was  identified.  This was traced up to the buttocks and the second foot was then  identified.  Both feet were delivered through the incision and the buttocks  and back were then delivered.  The arms were Pinarded and the head was  atraumatically delivered with the aid of fundal pressure.  Mouth and nose  were suctioned.  The infant cried spontaneously.  Cord was doubly clamped  and cut and the infant was carried to the pediatric team who was in  attendance.  Cord blood and cord pH was obtained.  pH was 7.29.  Apgars were  assigned at 7 and 8.  The posterior placenta previa easily released from the  posterior wall without evidence of excessive bleeding.  The uterus  contracted well.  There was no other defect noted.  A small indentation on  the anterior uterine wall from the previous classical cesarean section was  noted but there was no significant defect.  The uterine incision was closed  with a running locked Vicryl suture.  There was excellent hemostasis.  Bleeders along the peritoneal edges were cauterized with electrocautery.  Pelvis was irrigated.  The ovaries were identified bilaterally.  There was a  posterior fibroid noted.  Peritoneum was closed with Vicryl suture.  There  was a fascial defect just to the right of the midline which was closed with  a number 0 Vicryl and then the fascia closed with number 1 Vicryl.  Subcutaneous tissue cauterized for bleeding and the skin closed with  subcuticular Monocryl.  Steri-Strips were applied.  Urine was clear and  yellow at the end of the case.  Sponge, needle, and instrument counts were  correct.  The patient received 1 g of Ancef prior to the closure of the  uterus and after the cord was clamped.                                               Georgina Peer, M.D.    JPN/MEDQ  D:   11/20/2002  T:  11/20/2002  Job:  [id number]

## 2011-02-09 NOTE — Discharge Summary (Signed)
[name], [name]                ACCOUNT NO.:  [id number]   MEDICAL RECORD NO.:  [id number]          PATIENT TYPE:  INP   LOCATION:  9155                          [facility]  [facility]   PHYSICIAN:  Rudy Jew. Ashley Royalty, M.D.DATE OF BIRTH:  08/16/1966   DATE OF ADMISSION:  10/24/2006  DATE OF DISCHARGE:  10/27/2006                               DISCHARGE SUMMARY   DISCHARGE DIAGNOSES:  1. Intrauterine pregnancy at 27 weeks 2 days gestation.  2. Fibroid uterus.  3. Had history of intrauterine fetal demise.  4. History of preeclampsia, severe.  5. Hypertension, chronic  6. Advanced maternal age (the patient declined AFP and amniocentesis).  7. Previous cesarean section x2.  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  9. placenta previa, marginal   OPERATIONS AND SPECIAL PROCEDURES:  None.   CONSULTATIONS:  1. Maternal fetal medicine, Dr. Margot Ables  2. Neurology, Dr. Thad Ranger.   HISTORY AND PHYSICAL:  This is a 46 year old, gravida 5, para 1-1-2-2,  [facility] Jan 27, 2007, 26 weeks 5 days gestation on admission.  The patient  has numerous risk factor as listed above.  She has been followed  carefully from a blood pressure standpoint throughout pregnancy and was  recently  started on started on labetalol 100 mg b.i.d. on or about  October 22, 2006.  Pregnancy-induced hypertension panel was obtained  October 22, 2006 or thereabouts and the study was completely negative.  She returned on the day of admission complaining of headache.  Blood  pressure was noted to be 162/98.  Decision was made to hospitalize her  for further evaluation and therapy.  For the remainder of the history and physical, please see the chart.   HOSPITAL COURSE:  The patient was admitted to [facility] at  [place].  Initial laboratory studies were drawn.  Labetalol was  changed to 200 mg in the evening with maintenance of 100 mg every  morning.  The ultrasound was obtained and the infant was noted to be AGA  and  biophysical profile 6/8.  Maternal fetal medicine was consulted, Dr.  Margot Ables.  Recommendations included:  1. Neurology consult.  2. A 24-hour urine.  3. Repeat PIH panel the next day.  4. NSTs daily while in the hospital.  5. Also fetal surveillance two times per week in the office      thereafter.  6. Fetal growth ultrasound in two weeks.  7. Reassessment of the placental location at the same time.   Neurology consultation was obtained per Dr. Thad Ranger.  His assessment  was transformed migraine.  His recommendations were for magnesium  oxide 200-400 mg b.i.d., 50-100 mg b.i.d. for prophylaxis as well as a  prednisone taper.  Orders were left in the hospital medical records.  He  did not write outpatient prescriptions, so I wrote prescriptions at the  time of discharge on his behalf as he requested.  On October 27, 2006  the patient was felt to be stable for discharge and was discharged home  afebrile in satisfactory condition.  She is to return to the office in 2  days for repeat NST.  I stressed to her the importance of proceeding  directly to the pharmacy and getting all of these medications filled and  taking them as recommended.  She is also to maintain relatively  sedentary lifestyle and to call for any problems whatsoever.      James A. Ashley Royalty, M.D.  Electronically Signed     JAM/MEDQ  D:  10/27/2006  T:  10/27/2006  Job:  [id number]

## 2011-02-09 NOTE — H&P (Signed)
[name], [name]                ACCOUNT NO.:  [id number]   MEDICAL RECORD NO.:  [id number]          PATIENT TYPE:  INP   LOCATION:  9155                          [facility]   PHYSICIAN:  Rudy Jew. Ashley Royalty, M.D.DATE OF BIRTH:  Feb 02, 1966   DATE OF ADMISSION:  10/24/2006  DATE OF DISCHARGE:                              HISTORY & PHYSICAL   HISTORY:  This is a 45 year old, gravida 5, para 1-1-2-2.  ECD  01/27/2007, currently 26 weeks 5 days gestation.  The patient has  numerous risk factors including fibroid uterus (small), history of  intrauterine fetal demise, placenta previa (marginal), history of  preeclampsia--severe, hypertension--chronic, advanced paternal age (for  which the patient declined amniocentesis and Alpha-fetoprotein  determinations), and previous cesarean section x2.  The patient has been  followed from a blood pressure standpoint throughout her pregnancy and  recently was started on labetalol 100 mg b.i.d. on or about 10/22/2006.  On that date the blood pressure was 160/100.  The patient's initial  blood pressure upon presentation to the office on 06/24/2006 was 122/64.  The patient denied any visual disturbance, epigastric pain, or right  upper quadrant pain on 10/22/2006 at which time this blood pressure was  observed.  PIH panel was obtained and was negative.  She was initiated  on labetalol.  She returned today complaining of a headache.  She states  that she has had, she believes migraines in the past; and certainly she  experienced some photophobia and phonophobia today.  The headache is  most located on the left side of her head.  She also states that her  fetal movement was diminished today.  For the remainder of the medical  history please see the [facility] forms.   PHYSICAL EXAMINATION:  GENERAL:  A well-developed, well-nourished,  pleasant black female in no acute distress, afebrile.  VITAL SIGNS:  Blood pressure, today, 162/98; pulse 72; vital  signs stable.  SKIN:  Warm and dry without lesions.  LYMPH NODES:  No cervical,  supraclavicular or inguinal adenopathy.  HEENT:  Normocephalic.  NECK:  Supple without thyromegaly.  CHEST:  Lungs are clear.  CARDIAC:  Regular  rate and rhythm.  BREASTS:  Exam deferred.  ABDOMEN:  Gravid with the  fundal height of approximately 27 cm.  Fetal heart tones were  auscultated.  MUSCULOSKELETAL:  No CVA tenderness.  No significant  edema. DTRs 1+ to 2+ and equal.  PELVIC EXAMINATION:  Deferred.   IMPRESSION:  1. Intrauterine pregnancy at 26 weeks 5 days gestation.  2. Fibroid uterus (small).  3. History of intrauterine fetal demise.  4. History of preeclampsia--severe.  5. Hypertension--chronic.  6. Advanced maternal age (patient decline AFP and amniocentesis).  7. Previous cesarean sections x2.  8. Headache--rule out severe preeclampsia.   PLAN:  In view of the patient's myriad of risk factors and recent  elevation of her blood pressure above previous levels, will admit.  The  labetalol is to be adjusted accordingly.  We will obtain PIH labs as  well as ultrasound and fetal surveillance.      James A. Ashley Royalty,  M.D.  Electronically Signed     JAM/MEDQ  D:  10/24/2006  T:  10/24/2006  Job:  [id number]

## 2011-06-28 ENCOUNTER — Encounter: Payer: Self-pay | Admitting: Gynecology

## 2011-06-28 DIAGNOSIS — D219 Benign neoplasm of connective and other soft tissue, unspecified: Secondary | ICD-10-CM | POA: Insufficient documentation

## 2011-06-28 DIAGNOSIS — N870 Mild cervical dysplasia: Secondary | ICD-10-CM | POA: Insufficient documentation

## 2011-07-05 ENCOUNTER — Other Ambulatory Visit (HOSPITAL_COMMUNITY)
Admission: RE | Admit: 2011-07-05 | Discharge: 2011-07-05 | Disposition: A | Discharge status: Home / Self Care | Payer: BC Managed Care – PPO | Source: Ambulatory Visit | Attending: Obstetrics and Gynecology | Admitting: Obstetrics and Gynecology

## 2011-07-05 ENCOUNTER — Ambulatory Visit (INDEPENDENT_AMBULATORY_CARE_PROVIDER_SITE_OTHER): Payer: BC Managed Care – PPO | Admitting: Obstetrics and Gynecology

## 2011-07-05 ENCOUNTER — Encounter: Payer: Self-pay | Admitting: Obstetrics and Gynecology

## 2011-07-05 VITALS — BP 124/76 | Ht 62.0 in | Wt 128.0 lb

## 2011-07-05 DIAGNOSIS — D219 Benign neoplasm of connective and other soft tissue, unspecified: Secondary | ICD-10-CM

## 2011-07-05 DIAGNOSIS — Z01419 Encounter for gynecological examination (general) (routine) without abnormal findings: Secondary | ICD-10-CM

## 2011-07-05 DIAGNOSIS — D259 Leiomyoma of uterus, unspecified: Secondary | ICD-10-CM

## 2011-07-05 DIAGNOSIS — E7800 Pure hypercholesterolemia, unspecified: Secondary | ICD-10-CM | POA: Insufficient documentation

## 2011-07-05 NOTE — Progress Notes (Signed)
Patient came to see me today for her annual GYN exam. She is having no trouble with her menstrual cycle. She is contraceptive by withdrawal. She's never had a mammogram. She does her lab with her PCP. She is having no pelvic pain.  Review of systems: 12 point review negative except as noted above.  Physical examination: HEENT within normal limits. Neck: Thyroid not large. No masses. Supraclavicular nodes: not enlarged. Breasts: Examined in both sitting midline position. No skin changes and no masses. Abdomen: Soft no guarding rebound or masses or hernia. Pelvic: External: Within normal limits. BUS: Within normal limits. Vaginal:within normal limits. Good estrogen effect. No evidence of cystocele rectocele or enterocele. Cervix: clean. Uterus: retroverted with 10 week fibroid. Adnexa: No masses. Rectovaginal exam: Confirmatory and negative. Extremities: Within normal limits.  Assessment: Stable fibroid  Plan: Mammogram. Discussed contraception. Patient is going to switch to condoms.

## 2011-07-11 NOTE — Progress Notes (Signed)
Informed and will put in recall for Dec since noDec schedule yet.

## 2011-10-08 ENCOUNTER — Ambulatory Visit (INDEPENDENT_AMBULATORY_CARE_PROVIDER_SITE_OTHER): Payer: BC Managed Care – PPO | Admitting: Obstetrics and Gynecology

## 2011-10-08 ENCOUNTER — Encounter: Payer: Self-pay | Admitting: Obstetrics and Gynecology

## 2011-10-08 ENCOUNTER — Other Ambulatory Visit (HOSPITAL_COMMUNITY)
Admission: RE | Admit: 2011-10-08 | Discharge: 2011-10-08 | Disposition: A | Discharge status: Home / Self Care | Payer: BC Managed Care – PPO | Source: Ambulatory Visit | Attending: Obstetrics and Gynecology | Admitting: Obstetrics and Gynecology

## 2011-10-08 DIAGNOSIS — N912 Amenorrhea, unspecified: Secondary | ICD-10-CM

## 2011-10-08 DIAGNOSIS — N870 Mild cervical dysplasia: Secondary | ICD-10-CM

## 2011-10-08 DIAGNOSIS — Z01419 Encounter for gynecological examination (general) (routine) without abnormal findings: Secondary | ICD-10-CM | POA: Insufficient documentation

## 2011-10-08 NOTE — Progress Notes (Signed)
The patient came back today with nausea mastodynia, amenorrhea and the last menstrual period approximately November 1. She had a positive home pregnancy test. She is somewhat nervous but happy to be pregnant. 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 She is now had 2 normal Paps.  Pelvic exam: Kennon Portela present. Pelvic exam: External: Within normal limits. BUS within normal limits. Vaginal exam: Within normal limits. Cervix: Clean. Uterus: retroverted, soft and slightly enlarged. Adnexa: Within normal limits. Rectovaginal exam: Within normal limits.   Assessment: #1. CIN-1 #2. Probable early pregnancy.  Plan: Quantitative hCG done. Tell patient to stop her pravastatin. Pap done. We will schedule ultrasound after looking at [facility].

## 2011-10-09 ENCOUNTER — Other Ambulatory Visit: Payer: Self-pay

## 2011-10-09 DIAGNOSIS — N912 Amenorrhea, unspecified: Secondary | ICD-10-CM

## 2011-10-09 LAB — HCG, QUANTITATIVE, PREGNANCY: hCG, Beta Chain, Quant, S: 20692.2 m[IU]/mL

## 2011-10-10 ENCOUNTER — Ambulatory Visit (INDEPENDENT_AMBULATORY_CARE_PROVIDER_SITE_OTHER): Payer: BC Managed Care – PPO

## 2011-10-10 ENCOUNTER — Ambulatory Visit (INDEPENDENT_AMBULATORY_CARE_PROVIDER_SITE_OTHER): Payer: BC Managed Care – PPO | Admitting: Obstetrics and Gynecology

## 2011-10-10 ENCOUNTER — Other Ambulatory Visit: Payer: Self-pay | Admitting: Obstetrics and Gynecology

## 2011-10-10 DIAGNOSIS — O3410 Maternal care for benign tumor of corpus uteri, unspecified trimester: Secondary | ICD-10-CM

## 2011-10-10 DIAGNOSIS — N852 Hypertrophy of uterus: Secondary | ICD-10-CM

## 2011-10-10 DIAGNOSIS — O09529 Supervision of elderly multigravida, unspecified trimester: Secondary | ICD-10-CM

## 2011-10-10 DIAGNOSIS — O365990 Maternal care for other known or suspected poor fetal growth, unspecified trimester, not applicable or unspecified: Secondary | ICD-10-CM

## 2011-10-10 DIAGNOSIS — N912 Amenorrhea, unspecified: Secondary | ICD-10-CM

## 2011-10-10 DIAGNOSIS — O34539 Maternal care for retroversion of gravid uterus, unspecified trimester: Secondary | ICD-10-CM

## 2011-10-10 DIAGNOSIS — O2690 Pregnancy related conditions, unspecified, unspecified trimester: Secondary | ICD-10-CM

## 2011-10-10 DIAGNOSIS — O2691 Pregnancy related conditions, unspecified, first trimester: Secondary | ICD-10-CM

## 2011-10-10 LAB — US OB TRANSVAGINAL

## 2011-10-10 NOTE — Progress Notes (Signed)
Patient came back today for ultrasound. On ultrasound her uterus is enlarged by a fibroid which is 3.6 x 8.2 x 4.5 cm. There is also an intrauterine pregnancy of 6.1 weeks. There is fetal heart activity. A fetal pole was seen. The patient has a corpus luteal cyst on her right ovary of approximately 3 cm. Her left ovary is normal. Her cervix is long and closed. There is no cul-de-sac fluid.  Assessment: Intrauterine pregnancy. Large fibroid.  Plan: Findings of ultrasound discussed in detail with the patient. Discussed possible complications due to the fibroid. Obviously these will be observed. Discussed need for genetic testing since patient is 22. Discussed noninvasive testing with not 100% guarantee and amniocentesis. Referred patient to Dr. Billy Coast and asked her to discuss with him as well. Continue prenatal vitamins. Once again emphasized the need to stop statin. She has already stopped it. Discussed hypertension and pregnancy. She will discuss with him as well.

## 2011-10-19 ENCOUNTER — Other Ambulatory Visit: Payer: Self-pay

## 2011-11-01 ENCOUNTER — Ambulatory Visit (HOSPITAL_COMMUNITY)
Admission: RE | Admit: 2011-11-01 | Discharge: 2011-11-01 | Disposition: A | Discharge status: Home / Self Care | Payer: BC Managed Care – PPO | Source: Ambulatory Visit | Attending: Obstetrics and Gynecology | Admitting: Obstetrics and Gynecology

## 2011-11-01 ENCOUNTER — Encounter (HOSPITAL_COMMUNITY): Payer: Self-pay

## 2011-11-01 ENCOUNTER — Other Ambulatory Visit (HOSPITAL_COMMUNITY): Payer: Self-pay | Admitting: Obstetrics and Gynecology

## 2011-11-01 VITALS — BP 119/75 | HR 81 | Wt 133.0 lb

## 2011-11-01 DIAGNOSIS — O3680X0 Pregnancy with inconclusive fetal viability, not applicable or unspecified: Secondary | ICD-10-CM

## 2011-11-01 DIAGNOSIS — O10919 Unspecified pre-existing hypertension complicating pregnancy, unspecified trimester: Secondary | ICD-10-CM

## 2011-11-01 DIAGNOSIS — Z1231 Encounter for screening mammogram for malignant neoplasm of breast: Secondary | ICD-10-CM | POA: Insufficient documentation

## 2011-11-01 DIAGNOSIS — Z719 Counseling, unspecified: Secondary | ICD-10-CM

## 2011-11-22 ENCOUNTER — Other Ambulatory Visit: Payer: Self-pay

## 2011-11-23 ENCOUNTER — Other Ambulatory Visit (HOSPITAL_COMMUNITY): Payer: Self-pay | Admitting: Obstetrics and Gynecology

## 2011-11-23 ENCOUNTER — Ambulatory Visit (HOSPITAL_COMMUNITY)
Admission: RE | Admit: 2011-11-23 | Discharge: 2011-11-23 | Disposition: A | Discharge status: Home / Self Care | Payer: BC Managed Care – PPO | Source: Ambulatory Visit | Attending: Obstetrics and Gynecology | Admitting: Obstetrics and Gynecology

## 2011-11-23 DIAGNOSIS — O09529 Supervision of elderly multigravida, unspecified trimester: Secondary | ICD-10-CM

## 2011-11-23 NOTE — Progress Notes (Signed)
Genetic Counseling  High-Risk Gestation Note  Appointment Date:  11/23/2011 Referred By: Serita Kyle, * Date of Birth:  March 01, 1966     Pregnancy History: [id number] Estimated Date of Delivery: 06/03/12 Estimated Gestational Age: [redacted]w[redacted]d Attending: Particia Nearing, MD   Mrs. AMIRACLE NEISES was seen for genetic counseling regarding a maternal age of 46 y.o. and an increased risk for Down syndrome, trisomy 58 and trisomy 13 based on First trimester screening. She was accompanied by her mother to today's visit.   They were counseled regarding maternal age and the association with risk for chromosome conditions due to nondisjunction with aging of the ova.   We reviewed chromosomes, nondisjunction, and the associated 1 in 8 risk for fetal aneuploidy at [redacted]w[redacted]d gestation related to a maternal age of 46 at delivery.  They were counseled that the risk for aneuploidy decreases as gestational age increases, accounting for those pregnancies which spontaneously abort.  We specifically discussed Down syndrome (trisomy 80), trisomies 22 and 17, and sex chromosome aneuploidies (47,XXX and 47,XXY) including the common features and prognoses of each.   We also reviewed Mrs. Yetta Barre' First trimester screen result and the associated increase in risk for fetal Down syndrome (1 in 24 to 1 in 7) and increase in risk for trisomy 18/13 (1 in 41 to 1 in 24).  She was counseled regarding other explanations for a screen positive result including normal variation and differences in maternal metabolism. She understands that First trimester screening provides a pregnancy specific risk for these conditions, but is not considered to be diagnostic.  Additionally, we reviewed the levels of one of the proteins analyzed (PAPP-A) is very low (0.1%ile). This has been associated with an increased risk for growth restriction or poor pregnancy outcome later in pregnancy; therefore, a follow up ultrasound for fetal growth would be available in  the third trimester.   They were counseled regarding available screening and diagnostic options including ultrasound, CVS and amniocentesis. A risk of 1 in 100 was given for CVS and 1 in 200-300 was given for amniocentesis, the primary concern being complication leading to spontaneous pregnancy loss.  The risks, benefits, and limitations of each of these options were reviewed in detail.  They were counseled that 50-80% of fetuses with Down syndrome and up to 90% of fetuses with trisomies 13 and 18, when well visualized, have detectable anomalies or soft markers by ultrasound.  After thoughtful consideration of these options, she elected to proceed with Chorionic Villus Sampling (CVS) for FISH for aneuploidy and karyotype analysis. CVS is scheduled for 11/27/11.  She understands that this test cannot rule out all birth defects or genetic syndromes.   Mrs. Quinonez was provided with written information regarding sickle cell anemia (SCA) including the carrier frequency and incidence in the Hispanic/African-American population, the availability of carrier testing and prenatal diagnosis if indicated.  In addition, we discussed that hemoglobinopathies are routinely screened for as part of the  newborn screening panel.  She declined hemoglobin electrophoresis today.   Both family histories were reviewed and found to be contributory for mental retardation in the patient's nephew (her sister's son). Attributed to umbilical cord accident at labor and delivery. He is otherwise healthy. We discussed that there are many different causes of mental retardation such as genetic differences, sporadic causes, or injuries.  A specific diagnosis for mental retardation can be determined in approximately 50% of cases.  In the remaining 50% of cases, a diagnosis may not ever be determined. In the case  of a suspected environmental cause, recurrence risk would not be increased for relatives. However, in the case of a different underlying  cause, recurrence risk estimate may change.   Additionally, the patient's maternal aunt reportedly had a congenital heart defect. Congenital heart defects occur in approximately 1% of pregnancies.  Congenital heart defects may occur due to multifactorial influences, chromosomal abnormalities, genetic syndromes or environmental exposures.  Isolated heart defects are generally multifactorial.  Given the reported family history and assuming multifactorial inheritance, the risk for a congenital heart defect in the current pregnancy does not appear to be increased above the general population risk based upon the family history. Without further information regarding the provided family history, an accurate genetic risk cannot be calculated. Further genetic counseling is warranted if more information is obtained.  Additionally, the father of the pregnancy is 3 years old. There is an approximate 0.5% risk for the fetus to have a new autosomal dominant gene mutation related to a paternal age of over 68.  There is a wide range of conditions which can be caused by new dominant gene mutations.  Genetic testing for advanced paternal age is not available at this time.  Mrs. SHAWNICE TILMON  denied exposure to environmental toxins or chemical agents. She denied the use of alcohol, tobacco or street drugs. She denied significant viral illnesses during the course of her pregnancy. Her medical and surgical histories were contributory for hypertension, for which she is treated with labetalol.     I counseled this couple regarding the above risks and available options.  The approximate face-to-face time with the genetic counselor was 40 minutes.    Quinn Plowman, MS,  Certified Genetic Counselor 11/23/2011

## 2011-11-27 ENCOUNTER — Ambulatory Visit (HOSPITAL_COMMUNITY)
Admission: RE | Admit: 2011-11-27 | Discharge: 2011-11-27 | Disposition: A | Discharge status: Home / Self Care | Payer: BC Managed Care – PPO | Source: Ambulatory Visit | Attending: Obstetrics and Gynecology | Admitting: Obstetrics and Gynecology

## 2011-11-27 ENCOUNTER — Other Ambulatory Visit (HOSPITAL_COMMUNITY): Payer: Self-pay | Admitting: Obstetrics and Gynecology

## 2011-11-27 ENCOUNTER — Other Ambulatory Visit: Payer: Self-pay

## 2011-11-27 DIAGNOSIS — O09529 Supervision of elderly multigravida, unspecified trimester: Secondary | ICD-10-CM | POA: Insufficient documentation

## 2011-11-27 DIAGNOSIS — O09299 Supervision of pregnancy with other poor reproductive or obstetric history, unspecified trimester: Secondary | ICD-10-CM | POA: Insufficient documentation

## 2011-11-27 DIAGNOSIS — O10019 Pre-existing essential hypertension complicating pregnancy, unspecified trimester: Secondary | ICD-10-CM | POA: Insufficient documentation

## 2011-11-27 DIAGNOSIS — O34219 Maternal care for unspecified type scar from previous cesarean delivery: Secondary | ICD-10-CM | POA: Insufficient documentation

## 2011-12-06 ENCOUNTER — Telehealth (HOSPITAL_COMMUNITY): Payer: Self-pay | Admitting: MS"

## 2011-12-06 NOTE — Telephone Encounter (Signed)
Left message for patient to return call regarding results. Also called patient's work number. Patient had already left work for the day.   Quinn Plowman, MS Patent attorney

## 2011-12-07 ENCOUNTER — Telehealth (HOSPITAL_COMMUNITY): Payer: Self-pay | Admitting: MS"

## 2011-12-07 NOTE — Telephone Encounter (Signed)
Called Liel P Nickelson to discuss her [facility], cell free fetal DNA testing.  We reviewed that the results are positive for Down syndrome, showing a greater than 99% risk for trisomy 21 in the current pregnancy. We reviewed that this is not considered diagnostic. However, this testing identified >99% of pregnancies with trisomy 21, and the false positive rate is <0.1%.     We reviewed the option of amniocentesis, if prenatal confirmation of this result is desired. We also discussed that alternatively, postnatal chromosome analysis can be performed to confirm this result. I offered Mrs. Folmer the option of follow-up genetic counseling to further discuss these results in detail and discussed the option of further discuss Down syndrome and available local and national resources for Down syndrome. Mrs. Genrich stated that she was good with these results and that she would discuss with her husband whether or not follow-up genetic counseling would be helpful for them. We also discussed that a detailed ultrasound would be available to the patient and could be performed at our office, if desired. Additionally, a fetal echocardiogram will be available to the patient in the pregnancy given the associated risk of congenital heart disease with Down syndrome. No follow-up appointments were made at this time.   Additionally, the results are within normal range for trisomies 13 and 18, showing a less than 1 in 10,000 risk for each of these. This testing detects >97% of pregnancies with trisomy 18, and >80% with trisomy 13; the false positive rate is <0.1% for all conditions.  She understands that this testing does not identify all genetic conditions.    Mrs. Winslett inquired about the gender of the pregnancy. We reviewed that this cell free fetal DNA testing ([facility]) does not assess for sex chromosome aneuploidy, and thus gender is not assessed via this test. All questions were answered to her satisfaction, she was encouraged to  call with additional questions or concerns and/or if follow-up appointments are desired. I reviewed with Mrs. JAKYRAH HOLLADAY that we would review these results with her primary OB as well.   Quinn Plowman, MS Patent attorney

## 2011-12-10 ENCOUNTER — Other Ambulatory Visit (HOSPITAL_COMMUNITY): Payer: Self-pay | Admitting: Obstetrics and Gynecology

## 2011-12-10 DIAGNOSIS — O289 Unspecified abnormal findings on antenatal screening of mother: Secondary | ICD-10-CM

## 2012-01-02 NOTE — Progress Notes (Signed)
MFM Consult  46 y/o [id number] at [redacted] weeks gestation.  Patient has a history of chronic hypertension and a prior delivery at [redacted] weeks gestation associated with an abruption, preeclampsia, and fetal demise.  She presents today for recommendations regarding pregnancy management.   Ms. [name] was feeling well today and had no specific complaints.  Ultrasound today was consistent with dating by early ultrasound with no gross abnormalities noted.  A large uterine fibroid was seen posteriorly measuring 6.6 cm in greatest dimension.   PMH is significant for chronic hypertension currently treated with labetalol 300 mg BID.  Risks of chronic hypertension in pregnancy were discussed including but not limited to fetal growth abnormalities, increased risks for recurrent preeclampsia, and placental insufficiency.  Serial ultrasound evaluations through the late second and third trimester are recommended to follow fetal growth.  Antenatal testing starting at 32 weeks and baseline 24 hour urine are also recommended.    [name] previous 23 week delivery was also discussed. Given a prior history of early severe preeclampsia, her recurrence risk for this is high and may be as much as 50%.  Signs and symptoms of typical and atypical preeclampsia were reviewed.  Close surveillance will be necessary through this gestation and the post partum period.  Recommend an evaluation for the presence of preeclampsia before adjusting antihypertensives after [redacted] weeks gestation.  This delivery was by classical cesarean section.  Due to this, delivery between 36-[redacted] weeks gestation is recommended.    Patient had a thrombophilia work up due to this history with normal findings with the exception of a slightly decreased protein S.  This was drawn in early gestation and likely is related to pregnancy associated changes and not a true thrombophilia.  Anticoagulation is not recommended at this time.    Advanced maternal age was also discussed.  At  this time, she declines all screening and diagnostic testing for fetal aneuploidy.    Questions answered.  Precautions given.    Please contact us if we can be of further assistance with this patient.  30 min

## 2012-01-03 ENCOUNTER — Ambulatory Visit (HOSPITAL_COMMUNITY)
Admission: RE | Admit: 2012-01-03 | Discharge: 2012-01-03 | Disposition: A | Discharge status: Home / Self Care | Payer: BC Managed Care – PPO | Source: Ambulatory Visit | Attending: Obstetrics and Gynecology | Admitting: Obstetrics and Gynecology

## 2012-01-03 VITALS — BP 126/75 | HR 70 | Wt 134.0 lb

## 2012-01-03 DIAGNOSIS — O10019 Pre-existing essential hypertension complicating pregnancy, unspecified trimester: Secondary | ICD-10-CM | POA: Insufficient documentation

## 2012-01-03 DIAGNOSIS — O358XX0 Maternal care for other (suspected) fetal abnormality and damage, not applicable or unspecified: Secondary | ICD-10-CM | POA: Insufficient documentation

## 2012-01-03 DIAGNOSIS — O289 Unspecified abnormal findings on antenatal screening of mother: Secondary | ICD-10-CM

## 2012-01-03 DIAGNOSIS — O3510X0 Maternal care for (suspected) chromosomal abnormality in fetus, unspecified, not applicable or unspecified: Secondary | ICD-10-CM | POA: Insufficient documentation

## 2012-01-03 DIAGNOSIS — O09299 Supervision of pregnancy with other poor reproductive or obstetric history, unspecified trimester: Secondary | ICD-10-CM | POA: Insufficient documentation

## 2012-01-03 DIAGNOSIS — O09529 Supervision of elderly multigravida, unspecified trimester: Secondary | ICD-10-CM | POA: Insufficient documentation

## 2012-01-03 DIAGNOSIS — Z1389 Encounter for screening for other disorder: Secondary | ICD-10-CM | POA: Insufficient documentation

## 2012-01-03 DIAGNOSIS — Z363 Encounter for antenatal screening for malformations: Secondary | ICD-10-CM | POA: Insufficient documentation

## 2012-01-03 DIAGNOSIS — O34219 Maternal care for unspecified type scar from previous cesarean delivery: Secondary | ICD-10-CM | POA: Insufficient documentation

## 2012-01-03 DIAGNOSIS — O351XX0 Maternal care for (suspected) chromosomal abnormality in fetus, not applicable or unspecified: Secondary | ICD-10-CM | POA: Insufficient documentation

## 2012-01-03 NOTE — Progress Notes (Signed)
Ms. Maiorino was seen for ultrasound appointment today.  Please see [facility] report for details.

## 2012-01-03 NOTE — Progress Notes (Signed)
I met with [name] and her husband for follow-up genetic counseling to discuss the increased risk for fetal Down syndrome based on NIPT (cell free fetal DNA) test results.   The results of the NIPT revealed a greater than 99% chance of fetal Down syndrome. We discussed that this testing identifies >99% of pregnancies with Down syndrome with a false positive rate of 0.1%. We reviewed that this testing is highly sensitive and specific, but is not considered diagnostic.   At the time of today's visit, the following were visualized: echogenic intracardiac focus, absent nasal bone, ventriculomegaly, and possible ventricular septal defect. Given the ultrasound finding and given that approximately half of children with Down syndrome have a congenital heart anomaly, fetal echocardiography was recommended in the pregnancy. Fetal echocardiogram was scheduled for 01/15/12, and follow-up ultrasound was planned on 01/31/12.   Mild ventriculomegaly is defined as lateral cerebral fetal ventricles measuring between 10-15mm.  In most cases, the size of the ventricles remains stable throughout gestation; however, progression or regression is possible.  Progression to greater than 15mm would be considered severe ventriculomegaly. When isolated, usually postnatal outcomes are normal; however, there is an increased risk for developmental delay. Studies have suggested an increased risk for fetal chromosome abnormalities with apparently isolated ventriculomegaly; the presence of additional ultrasound findings may increase this risk. Generally, the larger the ventricles the greater the concern. Mild ventriculomegaly is also associated with an increased risk of CNS and non-CNS anomalies, many of which are subtle and may not be identified by prenatal ultrasound.  Differential diagnoses include infectious etiologies, neural tube defects and genetic syndromes, specifically Down syndrome. We discussed that once ventriculomegaly is  identified in pregnancy, follow-up ultrasounds are offered to assess for progression of ventriculomegaly. Following delivery, post-natal studies may be required in order to track progress and assess for any other differences in brain anatomy. It was discussed that surgical intervention may be required in some cases of ventriculomegaly. We also discussed the option of a prenatal consult with neonatology and pediatric neurosurgery. The couple may let us know if they are interested in these consultations prior to delivery and we will assist with scheduling these.   They were counseled that given the ultrasound findings and the positive cell free fetal DNA test result, the suspected diagnosis of Down syndrome is likely accurate. Cell free fetal DNA test can not distinguish between aneuploidy confined to the placenta, trisomy 21, translocation 21, or mosaic trisomy 21. We discussed the availability of amniocentesis or postnatal peripheral blood chromosome analysis for confirmation of the diagnosis. This couple declined the option of amniocentesis at this time. They wish to pursue postnatal confirmatory testing.   There are different types of Down syndrome, and each type is determined by the arrangement of the #21 chromosomes. Approximately 95% of cases of Down syndrome are trisomy 21 and 2-4% are due to a translocation involving chromosome 21. We reviewed chromosomes, nondisjunction, and that chromosome division errors happen by chance and are not usually inherited. They understand that confirmatory testing will provide an actual karyotype and will help to determine accurate risks of recurrence for a future pregnancy.   Down syndrome occurs once per every 800 births and is associated with specific features. However, we discussed that there is a high degree of variability seen among children who have this condition, meaning that every child with Down syndrome will not be affected in exactly the same way, and some  children will have more or less features than others. Approximately  half of individuals with Down syndrome have a cardiac anomaly and ~10-15% have an intestinal difference. Other anomalies of various organ systems have also been described in association with this diagnosis.   We discussed that in general most individuals with Down syndrome have mild to moderate mental retardation and likely will require extra assistance with school work. Approximately 70-80% of children with Down syndrome have hypotonia which may lead to delays in sitting, walking, and talking. We discussed that hypotonia does tend to improve with age and early intervention services such as physical, occupational, and speech therapies can help with achievement of developmental milestones. These therapies are typically provided to children (with qualifying diagnoses) from birth until age 54. We also discussed that Down syndrome is associated with characteristic facial features. Because of these facial features, many children with Down syndrome look similar to each other, but we reviewed that each child with Down syndrome is unique and will have many more features in common with his or her own family members.   The [employer] have established health supervision guidelines for individuals with Down syndrome. These guidelines provide medical management recommendations for various stages of life and can be a resource for families who have a child with Down syndrome as well as for health professionals. We also discussed that providing children with Down syndrome with a stimulating physical and social environment, as well as ensuring that the child receives adequate medical care and appropriate therapies, will help these children to reach their full potential. With the advances in medical technology, early intervention, and supportive therapies, many individuals with Down syndrome are able to live with an increasing degree of independence. Today, many adults  with Down syndrome care for themselves, have jobs, and often times live in group homes or apartments where assistance is available if needed.   We discussed local and national support organizations and provided the couple with written information about Down syndrome from these sources. Additionally, we discussed that postnatal health management can be coordinated by a medical geneticist as well as with a multidisciplinary team of physicians (Down syndrome clinic). This couple was encouraged to contact us if they have any additional questions/concerns or if they need additional supportive or informational recommendations.    Quinn Plowman, MS Patent attorney

## 2012-01-09 ENCOUNTER — Emergency Department (HOSPITAL_COMMUNITY)
Admission: EM | Admit: 2012-01-09 | Discharge: 2012-01-10 | Disposition: A | Discharge status: Home / Self Care | Payer: BC Managed Care – PPO | Attending: Emergency Medicine | Admitting: Emergency Medicine

## 2012-01-09 ENCOUNTER — Other Ambulatory Visit: Payer: Self-pay

## 2012-01-09 ENCOUNTER — Encounter (HOSPITAL_COMMUNITY): Payer: Self-pay | Admitting: Emergency Medicine

## 2012-01-09 DIAGNOSIS — Z331 Pregnant state, incidental: Secondary | ICD-10-CM

## 2012-01-09 DIAGNOSIS — Z569 Unspecified problems related to employment: Secondary | ICD-10-CM | POA: Insufficient documentation

## 2012-01-09 DIAGNOSIS — O99891 Other specified diseases and conditions complicating pregnancy: Secondary | ICD-10-CM | POA: Insufficient documentation

## 2012-01-09 DIAGNOSIS — F419 Anxiety disorder, unspecified: Secondary | ICD-10-CM

## 2012-01-09 DIAGNOSIS — R0602 Shortness of breath: Secondary | ICD-10-CM | POA: Insufficient documentation

## 2012-01-09 DIAGNOSIS — Z566 Other physical and mental strain related to work: Secondary | ICD-10-CM

## 2012-01-09 DIAGNOSIS — R51 Headache: Secondary | ICD-10-CM | POA: Insufficient documentation

## 2012-01-09 DIAGNOSIS — R1011 Right upper quadrant pain: Secondary | ICD-10-CM | POA: Insufficient documentation

## 2012-01-09 DIAGNOSIS — F411 Generalized anxiety disorder: Secondary | ICD-10-CM | POA: Insufficient documentation

## 2012-01-09 LAB — COMPREHENSIVE METABOLIC PANEL
Albumin: 3 g/dL — ABNORMAL LOW (ref 3.5–5.2)
Alkaline Phosphatase: 57 U/L (ref 39–117)
BUN: 6 mg/dL (ref 6–23)
Creatinine, Ser: 0.45 mg/dL — ABNORMAL LOW (ref 0.50–1.10)
Potassium: 3.6 mEq/L (ref 3.5–5.1)
Total Protein: 6.5 g/dL (ref 6.0–8.3)

## 2012-01-09 LAB — URINALYSIS, ROUTINE W REFLEX MICROSCOPIC
Leukocytes, UA: NEGATIVE
Nitrite: NEGATIVE
Specific Gravity, Urine: 1.016 (ref 1.005–1.030)
pH: 6.5 (ref 5.0–8.0)

## 2012-01-09 LAB — LIPASE, BLOOD: Lipase: 31 U/L (ref 11–59)

## 2012-01-09 LAB — CBC
Hemoglobin: 11.4 g/dL — ABNORMAL LOW (ref 12.0–15.0)
MCH: 28.1 pg (ref 26.0–34.0)
MCHC: 33.3 g/dL (ref 30.0–36.0)
RDW: 13.2 % (ref 11.5–15.5)

## 2012-01-09 LAB — DIFFERENTIAL
Basophils Absolute: 0 10*3/uL (ref 0.0–0.1)
Basophils Relative: 0 % (ref 0–1)
Eosinophils Absolute: 0.2 10*3/uL (ref 0.0–0.7)
Monocytes Relative: 12 % (ref 3–12)
Neutrophils Relative %: 47 % (ref 43–77)

## 2012-01-09 MED ORDER — ACETAMINOPHEN 325 MG PO TABS
650.0000 mg | ORAL_TABLET | Freq: Once | ORAL | Status: AC
  Administered 2012-01-09: 650 mg via ORAL
  Filled 2012-01-09: qty 2

## 2012-01-09 MED ORDER — LABETALOL HCL 300 MG PO TABS
300.0000 mg | ORAL_TABLET | Freq: Once | ORAL | Status: AC
  Administered 2012-01-09: 300 mg via ORAL
  Filled 2012-01-09: qty 1

## 2012-01-09 NOTE — Discharge Instructions (Signed)
Please rest and drink plenty of fluids, eat well, and take good care of yourself during your pregnancy.  Call your obstetrician tomorrow to schedule a close follow up appointment.  If you develop abdominal pain or vaginal bleeding, please call your obstetrician and go to [facility] hospital.  You may return to the ER at any time for worsening condition or any new symptoms that concern you.  Stress Management Stress is a state of physical or mental tension that often results from changes in your life or normal routine. Some common causes of stress are:  Death of a loved one.   Injuries or severe illnesses.   Getting fired or changing jobs.   Moving into a new home.  Other causes may be:  Sexual problems.   Business or financial losses.   Taking on a large debt.   Regular conflict with someone at home or at work.   Constant tiredness from lack of sleep.  It is not just bad things that are stressful. It may be stressful to:  Win the lottery.   Get married.   Buy a new car.  The amount of stress that can be easily tolerated varies from person to person. Changes generally cause stress, regardless of the types of change. Too much stress can affect your health. It may lead to physical or emotional problems. Too little stress (boredom) may also become stressful. SUGGESTIONS TO REDUCE STRESS:  Talk things over with your family and friends. It often is helpful to share your concerns and worries. If you feel your problem is serious, you may want to get help from a professional counselor.   Consider your problems one at a time instead of lumping them all together. Trying to take care of everything at once may seem impossible. List all the things you need to do and then start with the most important one. Set a goal to accomplish 2 or 3 things each day. If you expect to do too many in a single day you will naturally fail, causing you to feel even more stressed.   Do not use alcohol or drugs to  relieve stress. Although you may feel better for a short time, they do not remove the problems that caused the stress. They can also be habit forming.   Exercise regularly - at least 3 times per week. Physical exercise can help to relieve that "uptight" feeling and will relax you.   The shortest distance between despair and hope is often a good night's sleep.   Go to bed and get up on time allowing yourself time for appointments without being rushed.   Take a short "time-out" period from any stressful situation that occurs during the day. Close your eyes and take some deep breaths. Starting with the muscles in your face, tense them, hold it for a few seconds, then relax. Repeat this with the muscles in your neck, shoulders, hand, stomach, back and legs.   Take good care of yourself. Eat a balanced diet and get plenty of rest.   Schedule time for having fun. Take a break from your daily routine to relax.  HOME CARE INSTRUCTIONS   Call if you feel overwhelmed by your problems and feel you can no longer manage them on your own.   Return immediately if you feel like hurting yourself or someone else.  Document Released: 03/06/2001 Document Revised: 08/30/2011 Document Reviewed: 10/27/2007 [facility] Patient Information 2012 [place], [place].  Stress Management Stress is a state of physical or  mental tension that often results from changes in your life or normal routine. Some common causes of stress are:  Death of a loved one.   Injuries or severe illnesses.   Getting fired or changing jobs.   Moving into a new home.  Other causes may be:  Sexual problems.   Business or financial losses.   Taking on a large debt.   Regular conflict with someone at home or at work.   Constant tiredness from lack of sleep.  It is not just bad things that are stressful. It may be stressful to:  Win the lottery.   Get married.   Buy a new car.  The amount of stress that can be easily tolerated  varies from person to person. Changes generally cause stress, regardless of the types of change. Too much stress can affect your health. It may lead to physical or emotional problems. Too little stress (boredom) may also become stressful. SUGGESTIONS TO REDUCE STRESS:  Talk things over with your family and friends. It often is helpful to share your concerns and worries. If you feel your problem is serious, you may want to get help from a professional counselor.   Consider your problems one at a time instead of lumping them all together. Trying to take care of everything at once may seem impossible. List all the things you need to do and then start with the most important one. Set a goal to accomplish 2 or 3 things each day. If you expect to do too many in a single day you will naturally fail, causing you to feel even more stressed.   Do not use alcohol or drugs to relieve stress. Although you may feel better for a short time, they do not remove the problems that caused the stress. They can also be habit forming.   Exercise regularly - at least 3 times per week. Physical exercise can help to relieve that "uptight" feeling and will relax you.   The shortest distance between despair and hope is often a good night's sleep.   Go to bed and get up on time allowing yourself time for appointments without being rushed.   Take a short "time-out" period from any stressful situation that occurs during the day. Close your eyes and take some deep breaths. Starting with the muscles in your face, tense them, hold it for a few seconds, then relax. Repeat this with the muscles in your neck, shoulders, hand, stomach, back and legs.   Take good care of yourself. Eat a balanced diet and get plenty of rest.   Schedule time for having fun. Take a break from your daily routine to relax.  HOME CARE INSTRUCTIONS   Call if you feel overwhelmed by your problems and feel you can no longer manage them on your own.    Return immediately if you feel like hurting yourself or someone else.  Document Released: 03/06/2001 Document Revised: 08/30/2011 Document Reviewed: 10/27/2007 [facility] Patient Information 2012 [place], [place].

## 2012-01-09 NOTE — ED Notes (Signed)
Patient reports that she began having SOB, "pounding of chest", and right upper abdominal pain that lasted approx. 5 minutes and then reoccured at 1430 and has been ongoing since. OB/GYN called by patient and instructed patient to come to the ED.

## 2012-01-09 NOTE — ED Notes (Signed)
Patient also reports that she is [redacted] weeks pregnant.

## 2012-01-09 NOTE — ED Notes (Signed)
Pt states that early this morning she began experiencing some SOB, chest pain "racing of the heart", and lightheadedness. States that OBGYN was informed and told to come to ER. Pt is [redacted] weeks pregnant and states that she has not felt baby move since early am Sunday. Also states that she is having some tightness in abdominal area.

## 2012-01-09 NOTE — ED Provider Notes (Signed)
History     CSN: [id number]  Arrival date & time 01/09/12  1754   First MD Initiated Contact with Patient 01/09/12 2038      Chief Complaint  Patient presents with  . Shortness of Breath  . Abdominal Pain    (Consider location/radiation/quality/duration/timing/severity/associated sxs/prior treatment) HPI Comments: Patient reports she has had 3-4 episodes of palpitations, lightheadedness, chest tightness, shortness of breath over the past 2 weeks.  Today she had two episodes of the same, the first lasting 3-5 minutes and the second lasting 4 hours.  Pt also has right sided headache and RUQ pain.  Pt states she has had these symptoms previously but has never gotten them checked out - states she has a LOT of stress at work and she is not eating or drinking well because she doesn't feel she has time to stop.  Denies fevers, N/V, change in bowel habits, vaginal bleeding, urinary symptoms, cough, sore throat.  Earlier reported she was not feeling the baby move though states she has since - she is 19 weeks, has just started feeling movements last week.    Patient is a 46 y.o. female presenting with shortness of breath and abdominal pain. The history is provided by the patient.  Shortness of Breath  Associated symptoms include shortness of breath. Pertinent negatives include no fever, no stridor, no cough and no wheezing.  Abdominal Pain The primary symptoms of the illness include abdominal pain and shortness of breath. The primary symptoms of the illness do not include fever, nausea, vomiting, diarrhea, dysuria, vaginal discharge or vaginal bleeding.  Symptoms associated with the illness do not include constipation.    Past Medical History  Diagnosis Date  . CIN I (cervical intraepithelial neoplasia I)   . Fibroid   . Elevated cholesterol     Past Surgical History  Procedure Date  . Cesarean section     X3  . Colposcopy     Family History  Problem Relation Age of Onset  .  Hypertension Mother   . Hypertension Father   . Stroke Father   . Diabetes Maternal Grandmother   . Heart disease Paternal Grandfather     History  Substance Use Topics  . Smoking status: Never Smoker   . Smokeless tobacco: Not on file  . Alcohol Use: No     rare    OB History    Grav Para Term Preterm Abortions TAB SAB Ect Mult Living   6 2 2  3 1 2   2       Review of Systems  Constitutional: Negative for fever.  Respiratory: Positive for chest tightness and shortness of breath. Negative for cough, wheezing and stridor.   Gastrointestinal: Positive for abdominal pain. Negative for nausea, vomiting, diarrhea and constipation.  Genitourinary: Negative for dysuria, vaginal bleeding and vaginal discharge.  Neurological: Positive for light-headedness. Negative for syncope.  All other systems reviewed and are negative.    Allergies  Review of patient's allergies indicates no known allergies.  Home Medications   Current Outpatient Rx  Name Route Sig Dispense Refill  . ALBUTEROL SULFATE HFA 108 (90 BASE) MCG/ACT IN AERS Inhalation Inhale 2 puffs into the lungs every 6 (six) hours as needed. For shortness of breath.    . LABETALOL HCL 300 MG PO TABS Oral Take 300 mg by mouth 2 (two) times daily.      Marland Kitchen PRENATAL MULTIVITAMIN CH Oral Take 1 tablet by mouth daily.      BP 148/89  Pulse 68  Temp(Src) 98.6 F (37 C) (Oral)  Resp 18  Ht 5\' 1"  (1.549 m)  SpO2 100%  LMP 07/26/2011  Physical Exam  Nursing note and vitals reviewed. Constitutional: She is oriented to person, place, and time. She appears well-developed and well-nourished. No distress.  HENT:  Head: Normocephalic and atraumatic.  Neck: Normal range of motion. Neck supple.  Cardiovascular: Normal rate, regular rhythm and normal heart sounds.   Pulmonary/Chest: Effort normal and breath sounds normal. No respiratory distress. She has no decreased breath sounds. She has no wheezes. She has no rhonchi. She has no  rales. She exhibits no tenderness.  Abdominal: Soft. Bowel sounds are normal. She exhibits no distension. There is tenderness in the right upper quadrant. There is no rebound and no guarding.       Gravid  Musculoskeletal: Normal range of motion. She exhibits no edema and no tenderness.  Neurological: She is alert and oriented to person, place, and time. She exhibits normal muscle tone. Coordination normal.  Skin: She is not diaphoretic.  Psychiatric: She has a normal mood and affect. Her behavior is normal. Judgment and thought content normal.    ED Course  Procedures (including critical care time)  Labs Reviewed  CBC - Abnormal; Notable for the following:    Hemoglobin 11.4 (*)    HCT 34.2 (*)    All other components within normal limits  COMPREHENSIVE METABOLIC PANEL - Abnormal; Notable for the following:    Sodium 133 (*)    Creatinine, Ser 0.45 (*)    Albumin 3.0 (*)    Total Bilirubin 0.2 (*)    All other components within normal limits  URINALYSIS, ROUTINE W REFLEX MICROSCOPIC - Abnormal; Notable for the following:    APPearance CLOUDY (*)    All other components within normal limits  DIFFERENTIAL  LIPASE, BLOOD  URINE CULTURE   No results found.    Date: 01/10/2012  Rate: 67  Rhythm: normal sinus rhythm  QRS Axis: normal  Intervals: normal  ST/T Wave abnormalities: normal  Conduction Disutrbances: none  Narrative Interpretation:   Old EKG Reviewed: none available.       9:04 PM Discussed patient with Dr Radford Pax who agrees with workup and plan.    9:05 PM Patient states she is due for her dose of labetolol 300mg . Will order from pharmacy.  11:48 PM Patient reports she is feeling well now.  I have discussed her results with her and her family member.    1. Anxiety   2. Stress at work   3. Pregnant state, incidental       MDM  Patient is 46 years old, [redacted] weeks pregnant, with increased stress at work and not eating or drinking well, presenting with  several episodes of palpitations, SOB, chest tightness, lightheadedness.  Workup is unremarkable.  Lungs CTAB and patient not SOB on my exam - thus I do not think that imagining is necessary at this time.  Patient was having "palpitations" while I was in the room several times and there was no change on the monitor, patient remained in normal sinus rhythm at a regular rate in the 70s.  I suspect these episodes are likely stress related and potentially related to not eating or staying well hydrated.  Patient's pregnancy has been without complications, and she was feeling the baby move while in the ED.  She felt better with rest and hydration.  Discussed importance of eating and drinking well, staying hydrated, decreasing stress.  Pt d/c home with close obstetric follow up, return for any new or worsening symptoms.  Patient verbalizes understanding and agrees with plan.         Rise Patience, PA 01/10/12 0552  Rise Patience, [place] 01/10/12 [phone]

## 2012-01-09 NOTE — ED Notes (Signed)
Pt ambulated to the restroom. Will get labs when pt returns.

## 2012-01-09 NOTE — ED Notes (Signed)
Owens Shark RN - [facility] RN at bedside

## 2012-01-10 ENCOUNTER — Ambulatory Visit (INDEPENDENT_AMBULATORY_CARE_PROVIDER_SITE_OTHER): Payer: BC Managed Care – PPO | Admitting: Internal Medicine

## 2012-01-10 ENCOUNTER — Encounter: Payer: Self-pay | Admitting: *Deleted

## 2012-01-10 ENCOUNTER — Encounter: Payer: Self-pay | Admitting: Internal Medicine

## 2012-01-10 VITALS — BP 125/78 | HR 81 | Ht 61.0 in | Wt 134.0 lb

## 2012-01-10 DIAGNOSIS — R002 Palpitations: Secondary | ICD-10-CM

## 2012-01-10 LAB — URINE CULTURE
Culture  Setup Time: 201304180156
Special Requests: NORMAL

## 2012-01-10 NOTE — Patient Instructions (Signed)
Your physician has recommended that you wear an event monitor. Event monitors are medical devices that record the heart's electrical activity. Doctors most often [place] these monitors to diagnose arrhythmias. Arrhythmias are problems with the speed or rhythm of the heartbeat. The monitor is a small, portable device. You can wear one while you do your normal daily activities. This is usually used to diagnose what is causing palpitations/syncope (passing out).  We will call you with results.

## 2012-01-10 NOTE — Progress Notes (Signed)
HPI Patient is a 46 year old who is referred for palpitaitons Began 3 to 4 wks ago.  Yesterday had 2 spells. When start episodes start abruptly.  Last 3 to 4 min  Felt dizzy.  Felt presyncopal.  No syncope. She did not have with prior pregnancies.  Or before pregnancy. She actually went to ER yesterday.  No signif findings. She is under increased stress with work.  On feet.  Physical requirements becoming difficult.  Unable to drink adequate fluids.   WIth prior pregnancies did have preeclampsia  No Known Allergies  Current Outpatient Prescriptions  Medication Sig Dispense Refill  . albuterol (PROVENTIL HFA;VENTOLIN HFA) 108 (90 BASE) MCG/ACT inhaler Inhale 2 puffs into the lungs every 6 (six) hours as needed. For shortness of breath.      . labetalol (NORMODYNE) 300 MG tablet Take 300 mg by mouth 2 (two) times daily.        . Prenatal Vit-Fe Fumarate-FA (PRENATAL MULTIVITAMIN) TABS Take 1 tablet by mouth daily.       No current facility-administered medications for this visit.   Facility-Administered Medications Ordered in Other Visits  Medication Dose Route Frequency Provider Last Rate Last Dose  . acetaminophen (TYLENOL) tablet 650 mg  650 mg Oral Once Rise Patience, [place]   650 mg at 01/09/12 2123  . labetalol (NORMODYNE) tablet 300 mg  300 mg Oral Once Rise Patience, PA   300 mg at 01/09/12 2123    Past Medical History  Diagnosis Date  . CIN I (cervical intraepithelial neoplasia I)   . Fibroid   . Elevated cholesterol     Past Surgical History  Procedure Date  . Cesarean section     X3  . Colposcopy     Family History  Problem Relation Age of Onset  . Hypertension Mother   . Hypertension Father   . Stroke Father   . Diabetes Maternal Grandmother   . Heart disease Paternal Grandfather     History   Social History  . Marital Status: Married    Spouse Name: N/A    Number of Children: N/A  . Years of Education: N/A   Occupational History  . Not on file.   Social  History Main Topics  . Smoking status: Never Smoker   . Smokeless tobacco: Not on file  . Alcohol Use: No     rare  . Drug Use: Not on file  . Sexually Active: Yes    Birth Control/ Protection: Condom   Other Topics Concern  . Not on file   Social History Narrative  . No narrative on file    Review of Systems:  All systems reviewed.  They are negative to the above problem except as previously stated.  Vital Signs: LMP 07/26/2011  BP  12//70; P 80; Wt 220   Physical Exam Patient is in NAD  HEENT:  Normocephalic, atraumatic. EOMI, PERRLA.  Neck: JVP is normal. No thyromegaly. No bruits.  Lungs: clear to auscultation. No rales no wheezes.  Heart: Regular rate and rhythm. Normal S1, S2. No S3.   No significant murmurs. PMI not displaced.  Abdomen: Distended.  No hepatomegaly.  Extremities:   Good distal pulses throughout. Tr lower extremity edema.  Musculoskeletal :moving all extremities.  Neuro:   alert and oriented x3.  CN II-XII grossly intact.  EKG:  SR  78    Assessment and Plan:  1.  Palpitations.  Patient had symptoms of palpitations in ER by report with  nothing on telemetry (reprot)  May not represent what she had earlier in day. I reassured her but I think it is important to get an event monitor.  Stressed that she needs to get adequate fluids, rest as needed.  IN this regard I have written that her work requirements change to help this. Note recent labs (TSH, CBC were WNL) 2.  HTN  Will need to be followed closely especially as pregnancy progresses.

## 2012-01-11 NOTE — ED Provider Notes (Signed)
Medical screening examination/treatment/procedure(s) were performed by non-physician practitioner and as supervising physician I was immediately available for consultation/collaboration.    Nelia Shi, MD 01/11/12 2055

## 2012-01-18 ENCOUNTER — Encounter (INDEPENDENT_AMBULATORY_CARE_PROVIDER_SITE_OTHER): Payer: BC Managed Care – PPO

## 2012-01-18 DIAGNOSIS — R002 Palpitations: Secondary | ICD-10-CM

## 2012-01-24 ENCOUNTER — Encounter: Payer: Self-pay | Admitting: Maternal and Fetal Medicine

## 2012-01-31 ENCOUNTER — Ambulatory Visit (HOSPITAL_COMMUNITY)
Admission: RE | Admit: 2012-01-31 | Discharge: 2012-01-31 | Disposition: A | Discharge status: Home / Self Care | Payer: BC Managed Care – PPO | Source: Ambulatory Visit | Attending: Obstetrics and Gynecology | Admitting: Obstetrics and Gynecology

## 2012-01-31 VITALS — BP 129/76 | HR 80 | Wt 141.0 lb

## 2012-01-31 DIAGNOSIS — O34219 Maternal care for unspecified type scar from previous cesarean delivery: Secondary | ICD-10-CM | POA: Insufficient documentation

## 2012-01-31 DIAGNOSIS — O09299 Supervision of pregnancy with other poor reproductive or obstetric history, unspecified trimester: Secondary | ICD-10-CM | POA: Insufficient documentation

## 2012-01-31 DIAGNOSIS — O3510X0 Maternal care for (suspected) chromosomal abnormality in fetus, unspecified, not applicable or unspecified: Secondary | ICD-10-CM | POA: Insufficient documentation

## 2012-01-31 DIAGNOSIS — O09529 Supervision of elderly multigravida, unspecified trimester: Secondary | ICD-10-CM | POA: Insufficient documentation

## 2012-01-31 DIAGNOSIS — O10019 Pre-existing essential hypertension complicating pregnancy, unspecified trimester: Secondary | ICD-10-CM | POA: Insufficient documentation

## 2012-01-31 DIAGNOSIS — O351XX0 Maternal care for (suspected) chromosomal abnormality in fetus, not applicable or unspecified: Secondary | ICD-10-CM | POA: Insufficient documentation

## 2012-01-31 NOTE — ED Notes (Signed)
Patient states positive fetal movement. Denies ucs, bleeding of leaking.

## 2012-02-21 ENCOUNTER — Telehealth: Payer: Self-pay | Admitting: *Deleted

## 2012-02-21 NOTE — Telephone Encounter (Signed)
LMOM for patient to call back concerning event monitor results.

## 2012-02-22 ENCOUNTER — Other Ambulatory Visit (HOSPITAL_COMMUNITY): Payer: Self-pay | Admitting: Maternal and Fetal Medicine

## 2012-02-22 ENCOUNTER — Ambulatory Visit (HOSPITAL_COMMUNITY)
Admission: RE | Admit: 2012-02-22 | Discharge: 2012-02-22 | Disposition: A | Discharge status: Home / Self Care | Payer: BC Managed Care – PPO | Source: Ambulatory Visit | Attending: Obstetrics and Gynecology | Admitting: Obstetrics and Gynecology

## 2012-02-22 DIAGNOSIS — O351XX0 Maternal care for (suspected) chromosomal abnormality in fetus, not applicable or unspecified: Secondary | ICD-10-CM

## 2012-02-22 DIAGNOSIS — O09529 Supervision of elderly multigravida, unspecified trimester: Secondary | ICD-10-CM | POA: Insufficient documentation

## 2012-02-22 DIAGNOSIS — O3510X0 Maternal care for (suspected) chromosomal abnormality in fetus, unspecified, not applicable or unspecified: Secondary | ICD-10-CM | POA: Insufficient documentation

## 2012-02-22 DIAGNOSIS — O10019 Pre-existing essential hypertension complicating pregnancy, unspecified trimester: Secondary | ICD-10-CM | POA: Insufficient documentation

## 2012-02-22 DIAGNOSIS — O365990 Maternal care for other known or suspected poor fetal growth, unspecified trimester, not applicable or unspecified: Secondary | ICD-10-CM

## 2012-02-22 DIAGNOSIS — O34219 Maternal care for unspecified type scar from previous cesarean delivery: Secondary | ICD-10-CM | POA: Insufficient documentation

## 2012-02-22 DIAGNOSIS — O09299 Supervision of pregnancy with other poor reproductive or obstetric history, unspecified trimester: Secondary | ICD-10-CM | POA: Insufficient documentation

## 2012-02-22 DIAGNOSIS — O3410 Maternal care for benign tumor of corpus uteri, unspecified trimester: Secondary | ICD-10-CM | POA: Insufficient documentation

## 2012-02-22 NOTE — Progress Notes (Signed)
Patient seen today  for follow up ultrasound.  See full report in [facility].  Nichole Gula, MD  IUP at 25 3/7 weeks Fetus with a screen positive cffDNA test for trisomy 21 Some improvement in the ventriculomegaly.  The right lateral ventricle appears normal; the left measures just over 1 cm An echogenic intracardiac focus is again noted; the remainder of the anatomy is normal.  Had a normal fetal echo. Mild polyhydramnios noted with a max verticle pocket of 9 cm. EFW at the 23rd %tile; AC at the 8th %tile Normal umbilical artery Doppler studies Fibroid uterus  Recommend follow up in 3 weeks for interval growth due to somewhat lagging [facility]

## 2012-03-12 LAB — OB RESULTS CONSOLE HIV ANTIBODY (ROUTINE TESTING): HIV: NONREACTIVE

## 2012-03-14 ENCOUNTER — Ambulatory Visit (HOSPITAL_COMMUNITY)
Admission: RE | Admit: 2012-03-14 | Discharge: 2012-03-14 | Disposition: A | Discharge status: Home / Self Care | Payer: BC Managed Care – PPO | Source: Ambulatory Visit | Attending: Obstetrics and Gynecology | Admitting: Obstetrics and Gynecology

## 2012-03-14 ENCOUNTER — Encounter (HOSPITAL_COMMUNITY): Payer: Self-pay

## 2012-03-14 ENCOUNTER — Other Ambulatory Visit (HOSPITAL_COMMUNITY): Payer: Self-pay | Admitting: Maternal and Fetal Medicine

## 2012-03-14 VITALS — BP 164/93 | HR 66 | Wt 143.0 lb

## 2012-03-14 DIAGNOSIS — O351XX0 Maternal care for (suspected) chromosomal abnormality in fetus, not applicable or unspecified: Secondary | ICD-10-CM

## 2012-03-14 DIAGNOSIS — O409XX0 Polyhydramnios, unspecified trimester, not applicable or unspecified: Secondary | ICD-10-CM

## 2012-03-14 DIAGNOSIS — O09529 Supervision of elderly multigravida, unspecified trimester: Secondary | ICD-10-CM | POA: Insufficient documentation

## 2012-03-14 DIAGNOSIS — O09299 Supervision of pregnancy with other poor reproductive or obstetric history, unspecified trimester: Secondary | ICD-10-CM | POA: Insufficient documentation

## 2012-03-14 DIAGNOSIS — O34219 Maternal care for unspecified type scar from previous cesarean delivery: Secondary | ICD-10-CM | POA: Insufficient documentation

## 2012-03-14 DIAGNOSIS — O10019 Pre-existing essential hypertension complicating pregnancy, unspecified trimester: Secondary | ICD-10-CM | POA: Insufficient documentation

## 2012-03-14 DIAGNOSIS — O3410 Maternal care for benign tumor of corpus uteri, unspecified trimester: Secondary | ICD-10-CM | POA: Insufficient documentation

## 2012-03-14 DIAGNOSIS — O365990 Maternal care for other known or suspected poor fetal growth, unspecified trimester, not applicable or unspecified: Secondary | ICD-10-CM

## 2012-03-14 DIAGNOSIS — O3510X0 Maternal care for (suspected) chromosomal abnormality in fetus, unspecified, not applicable or unspecified: Secondary | ICD-10-CM | POA: Insufficient documentation

## 2012-03-16 ENCOUNTER — Inpatient Hospital Stay (HOSPITAL_COMMUNITY)
Admission: AD | Admit: 2012-03-16 | Discharge: 2012-03-16 | Disposition: A | Discharge status: Home / Self Care | Payer: BC Managed Care – PPO | Source: Ambulatory Visit | Attending: Obstetrics and Gynecology | Admitting: Obstetrics and Gynecology

## 2012-03-16 DIAGNOSIS — R109 Unspecified abdominal pain: Secondary | ICD-10-CM | POA: Insufficient documentation

## 2012-03-16 DIAGNOSIS — O10019 Pre-existing essential hypertension complicating pregnancy, unspecified trimester: Secondary | ICD-10-CM | POA: Insufficient documentation

## 2012-03-16 DIAGNOSIS — O10919 Unspecified pre-existing hypertension complicating pregnancy, unspecified trimester: Secondary | ICD-10-CM

## 2012-03-16 DIAGNOSIS — O99891 Other specified diseases and conditions complicating pregnancy: Secondary | ICD-10-CM | POA: Insufficient documentation

## 2012-03-16 LAB — COMPREHENSIVE METABOLIC PANEL
ALT: 25 U/L (ref 0–35)
AST: 22 U/L (ref 0–37)
Albumin: 2.7 g/dL — ABNORMAL LOW (ref 3.5–5.2)
Alkaline Phosphatase: 82 U/L (ref 39–117)
BUN: 4 mg/dL — ABNORMAL LOW (ref 6–23)
CO2: 22 mEq/L (ref 19–32)
Calcium: 9.3 mg/dL (ref 8.4–10.5)
Chloride: 103 mEq/L (ref 96–112)
Creatinine, Ser: 0.43 mg/dL — ABNORMAL LOW (ref 0.50–1.10)
GFR calc Af Amer: >90 mL/min (ref >90)
GFR calc non Af Amer: >90 mL/min (ref >90)
Glucose, Bld: 91 mg/dL (ref 70–99)
Potassium: 3.4 mEq/L — ABNORMAL LOW (ref 3.5–5.1)
Sodium: 136 mEq/L (ref 135–145)
Total Bilirubin: 0.2 mg/dL — ABNORMAL LOW (ref 0.3–1.2)
Total Protein: 6.2 g/dL (ref 6.0–8.3)

## 2012-03-16 LAB — CBC
HCT: 34 % — ABNORMAL LOW (ref 36.0–46.0)
Hemoglobin: 11.2 g/dL — ABNORMAL LOW (ref 12.0–15.0)
MCH: 27.7 pg (ref 26.0–34.0)
MCHC: 32.9 g/dL (ref 30.0–36.0)
MCV: 84.2 fL (ref 78.0–100.0)
Platelets: 214 10*3/uL (ref 150–400)
RBC: 4.04 MIL/uL (ref 3.87–5.11)
RDW: 13.7 % (ref 11.5–15.5)
WBC: 4.5 10*3/uL (ref 4.0–10.5)

## 2012-03-16 LAB — URIC ACID: Uric Acid, Serum: 3 mg/dL (ref 2.4–7.0)

## 2012-03-16 LAB — LACTATE DEHYDROGENASE: LDH: 178 U/L (ref 94–250)

## 2012-03-16 MED ORDER — LABETALOL HCL 300 MG PO TABS: 300.0000 mg | ORAL_TABLET | Freq: Three times a day (TID) | ORAL | Status: DC

## 2012-03-16 MED ORDER — LABETALOL HCL 100 MG PO TABS
300.0000 mg | ORAL_TABLET | Freq: Once | ORAL | Status: AC
  Administered 2012-03-16: 300 mg via ORAL
  Filled 2012-03-16: qty 2
  Filled 2012-03-16: qty 1

## 2012-03-16 NOTE — MAU Note (Signed)
Meal tray given to patient.

## 2012-03-16 NOTE — MAU Note (Signed)
Marlinda Mike CNM into see patient.

## 2012-03-16 NOTE — Discharge Instructions (Signed)
Increase labetalol to 300 mg every 8 hours (three times daily instead of twice) Increase water intake - maintain good intake of water daily Empty bladder every 2-4 hours while awake Daily fetal kick counts - call for decrease in movements > 1 hour for evaluation Call for apt at [facility] this week - recheck blood pressure and symptoms Tuesday or Wednesday

## 2012-03-16 NOTE — Progress Notes (Signed)
Another pillow to pt and helped to change position. Menu to pt and will order bkf at 0700

## 2012-03-16 NOTE — Progress Notes (Signed)
Up to BR.

## 2012-03-16 NOTE — MAU Provider Note (Signed)
  History     CSN: [id number]  Arrival date and time: 03/16/12 0331 Call from nurse for orders at 0415 Orders resulted - notified by 0547 In to evaluate patient at 0655   Chief Complaint  Patient presents with  . Abdominal Pain    Lower abdominal pain; right upper quadrant pain; decreased fetal movement   Elevated BP x 2 days / intermittent headache and blurred vision - sent for BP check Saturday. Arrival to MAU ~0330 am Sunday. Reports cramping and upper abdominal pain in addition to decreased FM that brought her in at this time to be evaluated.  HPI   Past Medical History  Diagnosis Date  . CIN I (cervical intraepithelial neoplasia I)   . Fibroid   . Elevated cholesterol     Past Surgical History  Procedure Date  . Cesarean section     X3  . Colposcopy     Family History  Problem Relation Age of Onset  . Hypertension Mother   . Hypertension Father   . Stroke Father   . Diabetes Maternal Grandmother   . Heart disease Paternal Grandfather     History  Substance Use Topics  . Smoking status: Never Smoker   . Smokeless tobacco: Not on file  . Alcohol Use: No     rare    Allergies: No Known Allergies  Prescriptions prior to admission  Medication Sig Dispense Refill  . labetalol (NORMODYNE) 300 MG tablet Take 300 mg by mouth 2 (two) times daily.        . Prenatal Vit-Fe Fumarate-FA (PRENATAL MULTIVITAMIN) TABS Take 1 tablet by mouth daily.        ROS Physical Exam   Blood pressure 169/93, pulse 66, temperature 98.4 F (36.9 C), temperature source Oral, resp. rate 20, height 5\' 1"  (1.549 m), weight 64.864 kg (143 lb), last menstrual period 07/26/2011.  Physical Exam Alert and oriented Abdomen soft and non-tender / no epigastric pain or tenderness Uterus gravid and non-tender / known large penduculated fibroid (>6cm) Extremities without edema / DTR 1+ bilaterally  FHR category 1 Toco decreased CTX activity since arrival and oral hydration - rare mild  ctx  MAU Course  Procedures  PIH labs  - stable  NO evidence of PEC or PTL  Assessment and Plan  28 weeks with chronic hypertension Fetus with increased risk DS / bilateral ventrimegaly  No evidence of PEC  1) discharge to home 2) Increase labetalol dose to 300 mg every 8 hours ( new rx sent to pharmacy today) 3) Daily FKC - call if decrease activity for recheck 4) Increase water hydration / keep bladder empty to reduce UI  Marlinda Mike 03/16/2012, 7:12 AM

## 2012-03-19 ENCOUNTER — Encounter (HOSPITAL_COMMUNITY): Payer: Self-pay | Admitting: *Deleted

## 2012-03-19 ENCOUNTER — Inpatient Hospital Stay (HOSPITAL_COMMUNITY)
Admission: AD | Admit: 2012-03-19 | Discharge: 2012-03-21 | DRG: 886 | Disposition: A | Discharge status: Home / Self Care | Payer: BC Managed Care – PPO | Source: Ambulatory Visit | Attending: Obstetrics and Gynecology | Admitting: Obstetrics and Gynecology

## 2012-03-19 DIAGNOSIS — O3410 Maternal care for benign tumor of corpus uteri, unspecified trimester: Secondary | ICD-10-CM | POA: Diagnosis present

## 2012-03-19 DIAGNOSIS — E7800 Pure hypercholesterolemia, unspecified: Secondary | ICD-10-CM

## 2012-03-19 DIAGNOSIS — D259 Leiomyoma of uterus, unspecified: Secondary | ICD-10-CM | POA: Diagnosis present

## 2012-03-19 DIAGNOSIS — D219 Benign neoplasm of connective and other soft tissue, unspecified: Secondary | ICD-10-CM

## 2012-03-19 DIAGNOSIS — O409XX0 Polyhydramnios, unspecified trimester, not applicable or unspecified: Secondary | ICD-10-CM | POA: Diagnosis present

## 2012-03-19 DIAGNOSIS — O10019 Pre-existing essential hypertension complicating pregnancy, unspecified trimester: Principal | ICD-10-CM | POA: Diagnosis present

## 2012-03-19 DIAGNOSIS — N870 Mild cervical dysplasia: Secondary | ICD-10-CM

## 2012-03-19 HISTORY — DX: Essential (primary) hypertension: I10

## 2012-03-19 LAB — COMPREHENSIVE METABOLIC PANEL
AST: 21 U/L (ref 0–37)
BUN: 4 mg/dL — ABNORMAL LOW (ref 6–23)
CO2: 24 mEq/L (ref 19–32)
Calcium: 9.3 mg/dL (ref 8.4–10.5)
Chloride: 102 mEq/L (ref 96–112)
Creatinine, Ser: 0.46 mg/dL — ABNORMAL LOW (ref 0.50–1.10)
GFR calc Af Amer: >90 mL/min (ref >90)
GFR calc non Af Amer: >90 mL/min (ref >90)
Glucose, Bld: 68 mg/dL — ABNORMAL LOW (ref 70–99)
Total Bilirubin: 0.2 mg/dL — ABNORMAL LOW (ref 0.3–1.2)

## 2012-03-19 LAB — OB RESULTS CONSOLE ABO/RH: RH Type: NEGATIVE

## 2012-03-19 LAB — CBC
HCT: 33.6 % — ABNORMAL LOW (ref 36.0–46.0)
Hemoglobin: 11 g/dL — ABNORMAL LOW (ref 12.0–15.0)
MCV: 84.6 fL (ref 78.0–100.0)
RDW: 13.4 % (ref 11.5–15.5)
WBC: 4 10*3/uL (ref 4.0–10.5)

## 2012-03-19 LAB — PROTIME-INR: INR: 0.88 (ref 0.00–1.49)

## 2012-03-19 LAB — URIC ACID: Uric Acid, Serum: 2.9 mg/dL (ref 2.4–7.0)

## 2012-03-19 MED ORDER — BETAMETHASONE SOD PHOS & ACET 6 (3-3) MG/ML IJ SUSP
12.0000 mg | Freq: Once | INTRAMUSCULAR | Status: AC
  Administered 2012-03-19: 12 mg via INTRAMUSCULAR
  Filled 2012-03-19: qty 2

## 2012-03-19 MED ORDER — ACETAMINOPHEN 325 MG PO TABS
650.0000 mg | ORAL_TABLET | ORAL | Status: DC | PRN
  Administered 2012-03-20 (×2): 650 mg via ORAL
  Filled 2012-03-19 (×2): qty 2

## 2012-03-19 MED ORDER — LABETALOL HCL 300 MG PO TABS
300.0000 mg | ORAL_TABLET | Freq: Three times a day (TID) | ORAL | Status: DC
  Administered 2012-03-19 – 2012-03-21 (×5): 300 mg via ORAL
  Filled 2012-03-19 (×8): qty 1

## 2012-03-19 MED ORDER — ZOLPIDEM TARTRATE 10 MG PO TABS
10.0000 mg | ORAL_TABLET | Freq: Every evening | ORAL | Status: DC | PRN
  Administered 2012-03-20 – 2012-03-21 (×2): 10 mg via ORAL
  Filled 2012-03-19 (×2): qty 1

## 2012-03-19 MED ORDER — MAGNESIUM SULFATE 40 G IN LACTATED RINGERS - SIMPLE
2.0000 g/h | INTRAVENOUS | Status: DC
  Administered 2012-03-19 – 2012-03-20 (×2): 2 g/h via INTRAVENOUS
  Filled 2012-03-19 (×2): qty 500

## 2012-03-19 MED ORDER — PRENATAL MULTIVITAMIN CH: 1.0000 | ORAL_TABLET | Freq: Every day | ORAL | Status: DC

## 2012-03-19 MED ORDER — DOCUSATE SODIUM 100 MG PO CAPS
100.0000 mg | ORAL_CAPSULE | Freq: Every day | ORAL | Status: DC
  Administered 2012-03-19 – 2012-03-20 (×2): 100 mg via ORAL
  Filled 2012-03-19 (×2): qty 1

## 2012-03-19 MED ORDER — CALCIUM CARBONATE ANTACID 500 MG PO CHEW: 2.0000 | CHEWABLE_TABLET | ORAL | Status: DC | PRN

## 2012-03-19 MED ORDER — BETAMETHASONE SOD PHOS & ACET 6 (3-3) MG/ML IJ SUSP
12.0000 mg | Freq: Once | INTRAMUSCULAR | Status: AC
  Administered 2012-03-20: 12 mg via INTRAMUSCULAR
  Filled 2012-03-19: qty 2

## 2012-03-19 MED ORDER — DOCUSATE SODIUM 100 MG PO CAPS: 100.0000 mg | ORAL_CAPSULE | Freq: Every day | ORAL | Status: DC

## 2012-03-19 MED ORDER — NIFEDIPINE ER 30 MG PO TB24
30.0000 mg | ORAL_TABLET | Freq: Every day | ORAL | Status: DC
  Administered 2012-03-19: 30 mg via ORAL
  Filled 2012-03-19 (×2): qty 1

## 2012-03-19 MED ORDER — PRENATAL MULTIVITAMIN CH
1.0000 | ORAL_TABLET | Freq: Every day | ORAL | Status: DC
  Administered 2012-03-19 – 2012-03-20 (×2): 1 via ORAL
  Filled 2012-03-19 (×2): qty 1

## 2012-03-19 MED ORDER — LACTATED RINGERS IV SOLN
INTRAVENOUS | Status: DC
  Administered 2012-03-19 – 2012-03-21 (×3): via INTRAVENOUS

## 2012-03-19 MED ORDER — MAGNESIUM SULFATE BOLUS VIA INFUSION
4.0000 g | Freq: Once | INTRAVENOUS | Status: AC
  Administered 2012-03-19: 4 g via INTRAVENOUS
  Filled 2012-03-19: qty 500

## 2012-03-19 NOTE — H&P (Signed)
[name] is a 46 y.o. female presenting for admission due to elevated BP in office today( 184/108, 174/102). Pt has known chronic HTN on med and has been compliant. Pt was recently seen in MAU and dose of labetalol was increased to 300mg  po TID. PIH labs were normal. She denies h/a, visual changes or leg swelling. Pt thinks may be related to stresses of home. PNC complicated by increased risk for DS. (+) ventriculomegaly and polyhydramnios for which she is followed by MFM. Pt also has uterine fibroids and previous C/S History OB History    Grav Para Term Preterm Abortions TAB SAB Ect Mult Living   6 2 2  3 1 2   2      Past Medical History  Diagnosis Date  . CIN I (cervical intraepithelial neoplasia I)   . Fibroid   . Elevated cholesterol   . Hypertension    Past Surgical History  Procedure Date  . Cesarean section     X3  . Colposcopy    Family History: family history includes Diabetes in her maternal grandmother; Heart disease in her paternal grandfather; Hypertension in her father and mother; and Stroke in her father. Social History:  reports that she has never smoked. She does not have any smokeless tobacco history on file. She reports that she does not drink alcohol. Her drug history not on file.   Prenatal Transfer Tool  Maternal Diabetes: No Genetic Screening: Abnormal:  Results: Elevated risk of Trisomy 21 Maternal Ultrasounds/Referrals: Normal Fetal Ultrasounds or other Referrals:  Referred to Materal Fetal Medicine  Maternal Substance Abuse:  No Significant Maternal Medications:  Meds include: Other: see prenatal record Significant Maternal Lab Results:  None Other Comments:  fetus followed for ventriculomegaly, polyhydramnios  ROS Neg except per HPI   Blood pressure 141/80, pulse 86, temperature 98.4 F (36.9 C), temperature source Oral, resp. rate 18, height 5\' 1"  (1.549 m), weight 64.411 kg (142 lb), last menstrual period 07/26/2011, SpO2  100.00%. Exam Physical Exam   SkIn: no lesion HEENT: anicteric sclera pink conjunctiva , oropharynx neg COR: RRR Lung: clear to A Breast: soft nontender (-)palp mass Abd; gravid soft FH 33 cm  (+) fibroid. Pfannenstiel scar Pelvic; deferred Extr: (-)edema or calf tenderness  Prenatal labs: ABO, Rh: --/--/A POS (06/26 1525) Antibody: NEG (06/26 1525) Rubella:  Immune RPR:   NR HBsAg:   neg HIV:   NR GBS:   unknown  Assessment/Plan: Chronc HTN r/o superimposed severe preeclampsia vs worsening HTN IUP @ 29 1/7 wk Previous C/S Fibroid uterus, Polyhydramnios Fetal anomaly  P) admit. Cont labetalol. Start Magnesium sulfate for CP prophylaxis, cont fetal monitor. PIH labs, 24 hr urine collection   [name] A 03/19/2012, 10:14 PM

## 2012-03-20 LAB — CREATININE CLEARANCE, URINE, 24 HOUR
Collection Interval-CRCL: 24 hours
Creatinine Clearance: 132 mL/min — ABNORMAL HIGH (ref 75–115)
Creatinine, 24H Ur: 874 mg/d (ref 700–1800)
Creatinine, Urine: 16.88 mg/dL
Creatinine: 0.46 mg/dL — ABNORMAL LOW (ref 0.50–1.10)

## 2012-03-20 MED ORDER — TERBUTALINE SULFATE 1 MG/ML IJ SOLN
0.2500 mg | Freq: Once | INTRAMUSCULAR | Status: AC
  Administered 2012-03-20: 0.25 mg via SUBCUTANEOUS
  Filled 2012-03-20: qty 1

## 2012-03-20 NOTE — Progress Notes (Signed)
S: notes  H/a since yesterday. (-) visual changes  O: BP 132/86 ( 117/87- 138/94) 180/104( admission) afebrile  Lungs clear to A Cor RRR grade 2/6 SEM  Abdomen: gravid nontender Extremity: no edema or calf tenderness DTR 1+ (-) clonus  Tracing: baseline 140 small accels (-)ctx PIH labs  normal IMP: chronic HTN ? Worsening HTn vs superimposed pre-eclampsia currently on Magnesium( IV) until complete steroids and 24 hr urine collection. On Labetalol and procardia ( which may be cause of h/a). Suspect BP issue may be related to home stresses IUP @ 29 2/7 weeks Fibroid uterus Polyhydramnios Increased DS risk  P) await 24 hr urine collection. Complete BMZ. Check Magnesium level. Cont labetalol. D/c procardia. D/c magnesium after complete steroid

## 2012-03-20 NOTE — Progress Notes (Signed)
UR Chart review completed.  

## 2012-03-21 LAB — PROTEIN, URINE, 24 HOUR
Collection Interval-UPROT: 24 hours
Protein, Urine: <3 mg/dL
Urine Total Volume-UPROT: 5175 mL

## 2012-03-21 MED ORDER — NIFEDIPINE ER OSMOTIC RELEASE 30 MG PO TB24: 30.0000 mg | ORAL_TABLET | Freq: Every day | ORAL | Status: DC

## 2012-03-21 NOTE — Progress Notes (Signed)
Patient ID: [name], female   DOB: 12/18/1965, 46 y.o.   MRN: [id number]   S:  Pt reports feeling well.  Some mild, persistant low pelvic pressure, but no contractions this am. Good FM's noted Denies HA, blurred vision, scotomas, epigastric pain, CP, SOB Eager to go home.  Has plan for children to stay with her sister in [place] for next 3 days. Pt requests social work consult as she has added stress due to hospital bills and not sure how to seek assistance.  O:  A&O x 3 Lungs: CTA Heart: RRR Abd: gravid, non tender LE's: No edema; no calf pain; no symptoms of DVT  VS: T 98.3; HR 68; RR 18 BP's:126-159/ 82-89  FHR 140's; no UC's  LABS: Hgb: 11.0/Hct: 33.6/PLT: 198 LFT's WNL 24hr urine WNL   A: [redacted]wks gestation Chronic HTN Fibroids Stress at home  P:  Stable for d/c home later today Case manager/social work to see pt before d/c home. RX: Procardia XL 30mg  po QD #30 Ref x 1 F/U at [facility] Tues, 03/25/12 @ 1030
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
## 2012-03-22 LAB — CULTURE, BETA STREP (GROUP B ONLY)

## 2012-03-23 LAB — TYPE AND SCREEN
ABO/RH(D): A POS
Antibody Screen: NEGATIVE
Unit division: 0

## 2012-03-24 ENCOUNTER — Telehealth: Payer: Self-pay | Admitting: *Deleted

## 2012-03-24 NOTE — Telephone Encounter (Signed)
Called patient again. Had not heard back from her since 5/30 attempt. Advised per Dr.Ross that event monitor from 4/26 thru 5/1 showed normal sinus rhythm and did not correlate with any arrhythmia. She states that she has less palps now then she did when she saw Dr.Ross.

## 2012-04-04 ENCOUNTER — Ambulatory Visit (HOSPITAL_COMMUNITY)
Admission: RE | Admit: 2012-04-04 | Discharge: 2012-04-04 | Disposition: A | Discharge status: Home / Self Care | Payer: BC Managed Care – PPO | Source: Ambulatory Visit | Attending: Obstetrics and Gynecology | Admitting: Obstetrics and Gynecology

## 2012-04-04 ENCOUNTER — Other Ambulatory Visit (HOSPITAL_COMMUNITY): Payer: Self-pay | Admitting: Maternal and Fetal Medicine

## 2012-04-04 VITALS — BP 132/83 | HR 82 | Wt 143.0 lb

## 2012-04-04 DIAGNOSIS — O351XX0 Maternal care for (suspected) chromosomal abnormality in fetus, not applicable or unspecified: Secondary | ICD-10-CM | POA: Insufficient documentation

## 2012-04-04 DIAGNOSIS — O34219 Maternal care for unspecified type scar from previous cesarean delivery: Secondary | ICD-10-CM | POA: Insufficient documentation

## 2012-04-04 DIAGNOSIS — O3510X0 Maternal care for (suspected) chromosomal abnormality in fetus, unspecified, not applicable or unspecified: Secondary | ICD-10-CM

## 2012-04-04 DIAGNOSIS — IMO0002 Reserved for concepts with insufficient information to code with codable children: Secondary | ICD-10-CM

## 2012-04-04 DIAGNOSIS — O10019 Pre-existing essential hypertension complicating pregnancy, unspecified trimester: Secondary | ICD-10-CM | POA: Insufficient documentation

## 2012-04-04 DIAGNOSIS — O409XX0 Polyhydramnios, unspecified trimester, not applicable or unspecified: Secondary | ICD-10-CM

## 2012-04-04 DIAGNOSIS — O3410 Maternal care for benign tumor of corpus uteri, unspecified trimester: Secondary | ICD-10-CM | POA: Insufficient documentation

## 2012-04-04 DIAGNOSIS — O09529 Supervision of elderly multigravida, unspecified trimester: Secondary | ICD-10-CM | POA: Insufficient documentation

## 2012-04-04 DIAGNOSIS — O09299 Supervision of pregnancy with other poor reproductive or obstetric history, unspecified trimester: Secondary | ICD-10-CM | POA: Insufficient documentation

## 2012-04-04 NOTE — Progress Notes (Signed)
Patient seen today  for follow up ultrasound.  See full report in [facility].  Alpha Gula, MD  IUP at 31+3 weeks Fetus with Trisomy 21 by cell free fetal DNA Normal interval anatomy Mild polyhydramnios (AFI of 25 cm) The estimated fetal weight today is at the 13th %tile; the [facility] measures at the 3rd %tile consistent with asymmetric growth Elevated umbilical artery Dopplers without evidence of absent flow or reversed diastolic flow Posterior LUS fibroid again noted (9 x 9 x 9.4 cm)  Recommend 2x weekly antepartum testing - NSTs in office and weekly BPPs/Doppler studies at [facility].

## 2012-04-10 ENCOUNTER — Other Ambulatory Visit (HOSPITAL_COMMUNITY): Payer: Self-pay | Admitting: Maternal and Fetal Medicine

## 2012-04-10 ENCOUNTER — Encounter (HOSPITAL_COMMUNITY): Payer: Self-pay

## 2012-04-10 ENCOUNTER — Ambulatory Visit (HOSPITAL_COMMUNITY)
Admission: RE | Admit: 2012-04-10 | Discharge: 2012-04-10 | Disposition: A | Discharge status: Home / Self Care | Payer: BC Managed Care – PPO | Source: Ambulatory Visit | Attending: Obstetrics and Gynecology | Admitting: Obstetrics and Gynecology

## 2012-04-10 VITALS — BP 121/78 | HR 76 | Wt 144.5 lb

## 2012-04-10 DIAGNOSIS — O351XX0 Maternal care for (suspected) chromosomal abnormality in fetus, not applicable or unspecified: Secondary | ICD-10-CM

## 2012-04-10 DIAGNOSIS — O09529 Supervision of elderly multigravida, unspecified trimester: Secondary | ICD-10-CM | POA: Insufficient documentation

## 2012-04-10 DIAGNOSIS — O3510X0 Maternal care for (suspected) chromosomal abnormality in fetus, unspecified, not applicable or unspecified: Secondary | ICD-10-CM | POA: Insufficient documentation

## 2012-04-10 DIAGNOSIS — O368990 Maternal care for other specified fetal problems, unspecified trimester, not applicable or unspecified: Secondary | ICD-10-CM

## 2012-04-10 DIAGNOSIS — O409XX0 Polyhydramnios, unspecified trimester, not applicable or unspecified: Secondary | ICD-10-CM | POA: Insufficient documentation

## 2012-04-10 DIAGNOSIS — O365990 Maternal care for other known or suspected poor fetal growth, unspecified trimester, not applicable or unspecified: Secondary | ICD-10-CM | POA: Insufficient documentation

## 2012-04-17 ENCOUNTER — Ambulatory Visit (HOSPITAL_COMMUNITY)
Admission: RE | Admit: 2012-04-17 | Discharge: 2012-04-17 | Disposition: A | Discharge status: Home / Self Care | Payer: BC Managed Care – PPO | Source: Ambulatory Visit | Attending: Obstetrics and Gynecology | Admitting: Obstetrics and Gynecology

## 2012-04-17 ENCOUNTER — Encounter (HOSPITAL_COMMUNITY): Payer: Self-pay

## 2012-04-17 VITALS — BP 125/83 | HR 85 | Wt 146.0 lb

## 2012-04-17 DIAGNOSIS — O368190 Decreased fetal movements, unspecified trimester, not applicable or unspecified: Secondary | ICD-10-CM | POA: Insufficient documentation

## 2012-04-17 DIAGNOSIS — O365990 Maternal care for other known or suspected poor fetal growth, unspecified trimester, not applicable or unspecified: Secondary | ICD-10-CM | POA: Insufficient documentation

## 2012-04-17 DIAGNOSIS — O09529 Supervision of elderly multigravida, unspecified trimester: Secondary | ICD-10-CM | POA: Insufficient documentation

## 2012-04-17 DIAGNOSIS — O409XX0 Polyhydramnios, unspecified trimester, not applicable or unspecified: Secondary | ICD-10-CM | POA: Insufficient documentation

## 2012-04-17 DIAGNOSIS — O3510X0 Maternal care for (suspected) chromosomal abnormality in fetus, unspecified, not applicable or unspecified: Secondary | ICD-10-CM | POA: Insufficient documentation

## 2012-04-17 DIAGNOSIS — O351XX0 Maternal care for (suspected) chromosomal abnormality in fetus, not applicable or unspecified: Secondary | ICD-10-CM

## 2012-04-17 NOTE — ED Notes (Signed)
Pt states decreased movement over the last 2 days.

## 2012-04-23 NOTE — Discharge Summary (Signed)
Physician Discharge Summary  Patient ID: [name] MRN: [id number] DOB/AGE: 04-22-1966 46 y.o.  Admit date: 03/19/2012 Discharge date: 04/23/2012  Admission Diagnoses:29wks, HTN; stress at home  Discharge Diagnoses: 29wks; HTN controlled Plan social work consult for addressing stress at home Active Problems:  * No active hospital problems. *    Discharged Condition: stable  Hospital Course: monitoring of BP's, controlled  Consults: None  Significant Diagnostic Studies: labs: WNL  Treatments: antihypertensives  Discharge Exam: Blood pressure 132/73, pulse 83, temperature 98.3 F (36.8 C), temperature source Oral, resp. rate 18, height 5\' 1"  (1.549 m), weight 64.411 kg (142 lb), last menstrual period 07/26/2011, SpO2 100.00%. General appearance: alert, cooperative and no distress See progress note for assmt details  Disposition: 01-Home or Self Care  Discharge Orders    Future Appointments: Provider: Department: Dept Phone: Center:   04/24/2012 9:30 AM [facility] [place] [facility] [phone] [facility]     Future Orders Please Complete By Expires   Discharge instructions      Comments:   Return to office for BP check   Tuesday, 03/25/12 @ 1030   PRETERM LABOR:  Includes any of the follwing symptoms that occur between 20 - [redacted] weeks gestation.  If these symptoms are not stopped, preterm labor can result in preterm delivery, placing your baby at risk      Notify physician for menstrual like cramps      Notify physician for uterine contractions.  These may be painless and feel like the uterus is tightening or the baby is  "balling up"      Notify physician for low, dull backache, unrelieved by heat or Tylenol      Notify physician for intestinal cramps, with or without diarrhea, sometimes described as "gas pain"      Notify physician for pelvic pressure      Notify physician for increase or change in vaginal discharge      Notify physician for vaginal bleeding      Notify  physician for a general feeling that "something is not right"      Notify physician for leaking of fluid      Discharge activity:      Comments:   Mostly rest, but out of bed around the house is ok   Discharge diet:  No restrictions      OB RESULTS CONSOLE GC/Chlamydia      Comments:   This external order was created through the Results Console.   OB RESULTS CONSOLE RPR      Comments:   This external order was created through the Results Console.   OB RESULTS CONSOLE HIV antibody      Comments:   This external order was created through the Results Console.   OB RESULTS CONSOLE ABO/Rh      Comments:   This external order was created through the Results Console.   OB RESULTS CONSOLE Antibody Screen      Comments:   This external order was created through the Results Console.     Medication List  As of 04/23/2012 10:12 AM   STOP taking these medications         HYDROcodone-acetaminophen 5-500 MG per tablet         TAKE these medications         labetalol 300 MG tablet   Commonly known as: NORMODYNE   Take 1 tablet (300 mg total) by mouth 3 (three) times daily. 300 mg three times daily - take  every 8 hours      NIFEdipine 30 MG 24 hr tablet   Commonly known as: PROCARDIA-XL/ADALAT-CC/NIFEDICAL-XL   Take 1 tablet (30 mg total) by mouth daily.      prenatal multivitamin Tabs   Take 1 tablet by mouth daily.           Follow-up Information    Follow up with Demetrius Revel, NP on 03/25/2012. (B/P check in office)    Contact information:   [facility] [address] [place] [address] [phone]          Signed: Zaydee Aina K 04/23/2012, 10:12 AM

## 2012-04-24 ENCOUNTER — Other Ambulatory Visit: Payer: Self-pay | Admitting: Obstetrics and Gynecology

## 2012-04-24 ENCOUNTER — Ambulatory Visit (HOSPITAL_COMMUNITY)
Admission: RE | Admit: 2012-04-24 | Discharge: 2012-04-24 | Disposition: A | Discharge status: Home / Self Care | Payer: BC Managed Care – PPO | Source: Ambulatory Visit | Attending: Obstetrics and Gynecology | Admitting: Obstetrics and Gynecology

## 2012-04-24 DIAGNOSIS — O34219 Maternal care for unspecified type scar from previous cesarean delivery: Secondary | ICD-10-CM | POA: Insufficient documentation

## 2012-04-24 DIAGNOSIS — O409XX0 Polyhydramnios, unspecified trimester, not applicable or unspecified: Secondary | ICD-10-CM | POA: Insufficient documentation

## 2012-04-24 DIAGNOSIS — O365990 Maternal care for other known or suspected poor fetal growth, unspecified trimester, not applicable or unspecified: Secondary | ICD-10-CM

## 2012-04-24 DIAGNOSIS — O09529 Supervision of elderly multigravida, unspecified trimester: Secondary | ICD-10-CM | POA: Insufficient documentation

## 2012-04-24 DIAGNOSIS — O351XX0 Maternal care for (suspected) chromosomal abnormality in fetus, not applicable or unspecified: Secondary | ICD-10-CM

## 2012-04-24 DIAGNOSIS — O3510X0 Maternal care for (suspected) chromosomal abnormality in fetus, unspecified, not applicable or unspecified: Secondary | ICD-10-CM | POA: Insufficient documentation

## 2012-04-24 DIAGNOSIS — O10019 Pre-existing essential hypertension complicating pregnancy, unspecified trimester: Secondary | ICD-10-CM | POA: Insufficient documentation

## 2012-04-25 ENCOUNTER — Other Ambulatory Visit: Payer: Self-pay | Admitting: Obstetrics and Gynecology

## 2012-04-25 ENCOUNTER — Encounter (HOSPITAL_COMMUNITY)
Admission: RE | Disposition: A | Discharge status: Home / Self Care | Payer: Self-pay | Source: Ambulatory Visit | Attending: Obstetrics and Gynecology

## 2012-04-25 ENCOUNTER — Encounter (HOSPITAL_COMMUNITY): Payer: Self-pay | Admitting: *Deleted

## 2012-04-25 ENCOUNTER — Inpatient Hospital Stay (HOSPITAL_COMMUNITY)
Admission: RE | Admit: 2012-04-25 | Discharge: 2012-05-02 | DRG: 370 | Disposition: A | Discharge status: Home / Self Care | Payer: BC Managed Care – PPO | Source: Ambulatory Visit | Attending: Obstetrics and Gynecology | Admitting: Obstetrics and Gynecology

## 2012-04-25 ENCOUNTER — Encounter (HOSPITAL_COMMUNITY): Payer: Self-pay | Admitting: Anesthesiology

## 2012-04-25 ENCOUNTER — Inpatient Hospital Stay (HOSPITAL_COMMUNITY): Payer: BC Managed Care – PPO | Admitting: Anesthesiology

## 2012-04-25 DIAGNOSIS — D696 Thrombocytopenia, unspecified: Secondary | ICD-10-CM

## 2012-04-25 DIAGNOSIS — K560 Paralytic ileus: Secondary | ICD-10-CM | POA: Diagnosis not present

## 2012-04-25 DIAGNOSIS — D4959 Neoplasm of unspecified behavior of other genitourinary organ: Secondary | ICD-10-CM | POA: Diagnosis present

## 2012-04-25 DIAGNOSIS — D259 Leiomyoma of uterus, unspecified: Secondary | ICD-10-CM | POA: Diagnosis present

## 2012-04-25 DIAGNOSIS — Y9210 Unspecified residential institution as the place of occurrence of the external cause: Secondary | ICD-10-CM | POA: Diagnosis not present

## 2012-04-25 DIAGNOSIS — O409XX0 Polyhydramnios, unspecified trimester, not applicable or unspecified: Secondary | ICD-10-CM | POA: Diagnosis present

## 2012-04-25 DIAGNOSIS — IMO0002 Reserved for concepts with insufficient information to code with codable children: Secondary | ICD-10-CM | POA: Insufficient documentation

## 2012-04-25 DIAGNOSIS — D689 Coagulation defect, unspecified: Secondary | ICD-10-CM | POA: Diagnosis not present

## 2012-04-25 DIAGNOSIS — D62 Acute posthemorrhagic anemia: Secondary | ICD-10-CM | POA: Diagnosis not present

## 2012-04-25 DIAGNOSIS — O10919 Unspecified pre-existing hypertension complicating pregnancy, unspecified trimester: Secondary | ICD-10-CM | POA: Diagnosis present

## 2012-04-25 DIAGNOSIS — D219 Benign neoplasm of connective and other soft tissue, unspecified: Secondary | ICD-10-CM

## 2012-04-25 DIAGNOSIS — O9903 Anemia complicating the puerperium: Secondary | ICD-10-CM | POA: Diagnosis not present

## 2012-04-25 DIAGNOSIS — O909 Complication of the puerperium, unspecified: Secondary | ICD-10-CM | POA: Diagnosis not present

## 2012-04-25 DIAGNOSIS — Z302 Encounter for sterilization: Secondary | ICD-10-CM

## 2012-04-25 DIAGNOSIS — N870 Mild cervical dysplasia: Secondary | ICD-10-CM

## 2012-04-25 DIAGNOSIS — O34599 Maternal care for other abnormalities of gravid uterus, unspecified trimester: Secondary | ICD-10-CM | POA: Diagnosis present

## 2012-04-25 DIAGNOSIS — O351XX0 Maternal care for (suspected) chromosomal abnormality in fetus, not applicable or unspecified: Secondary | ICD-10-CM

## 2012-04-25 DIAGNOSIS — E7800 Pure hypercholesterolemia, unspecified: Secondary | ICD-10-CM

## 2012-04-25 DIAGNOSIS — K567 Ileus, unspecified: Secondary | ICD-10-CM

## 2012-04-25 DIAGNOSIS — O1002 Pre-existing essential hypertension complicating childbirth: Secondary | ICD-10-CM | POA: Diagnosis present

## 2012-04-25 DIAGNOSIS — O3510X0 Maternal care for (suspected) chromosomal abnormality in fetus, unspecified, not applicable or unspecified: Secondary | ICD-10-CM

## 2012-04-25 DIAGNOSIS — O09529 Supervision of elderly multigravida, unspecified trimester: Secondary | ICD-10-CM

## 2012-04-25 DIAGNOSIS — O34219 Maternal care for unspecified type scar from previous cesarean delivery: Principal | ICD-10-CM | POA: Diagnosis present

## 2012-04-25 DIAGNOSIS — O365990 Maternal care for other known or suspected poor fetal growth, unspecified trimester, not applicable or unspecified: Secondary | ICD-10-CM | POA: Diagnosis present

## 2012-04-25 DIAGNOSIS — K9189 Other postprocedural complications and disorders of digestive system: Secondary | ICD-10-CM

## 2012-04-25 HISTORY — PX: MYOMECTOMY: SHX85

## 2012-04-25 HISTORY — PX: CYSTOSCOPY: SHX5120

## 2012-04-25 HISTORY — DX: Other postprocedural complications and disorders of digestive system: K91.89

## 2012-04-25 HISTORY — DX: Ileus, unspecified: K56.7

## 2012-04-25 HISTORY — DX: Thrombocytopenia, unspecified: D69.6

## 2012-04-25 LAB — SURGICAL PCR SCREEN: MRSA, PCR: NEGATIVE

## 2012-04-25 LAB — CBC
HCT: 37.8 % (ref 36.0–46.0)
Hemoglobin: 12.5 g/dL (ref 12.0–15.0)
Hemoglobin: 11.8 g/dL — ABNORMAL LOW (ref 12.0–15.0)
MCHC: 33.1 g/dL (ref 30.0–36.0)
Platelets: 115 10*3/uL — ABNORMAL LOW (ref 150–400)
RBC: 4.1 MIL/uL (ref 3.87–5.11)
WBC: 5.4 10*3/uL (ref 4.0–10.5)
WBC: 18.6 10*3/uL — ABNORMAL HIGH (ref 4.0–10.5)

## 2012-04-25 LAB — BASIC METABOLIC PANEL
CO2: 23 mEq/L (ref 19–32)
Glucose, Bld: 74 mg/dL (ref 70–99)
Potassium: 4 mEq/L (ref 3.5–5.1)
Sodium: 135 mEq/L (ref 135–145)

## 2012-04-25 LAB — PREPARE RBC (CROSSMATCH)

## 2012-04-25 SURGERY — Surgical Case
Anesthesia: Spinal | Site: Bladder | Wound class: Clean Contaminated

## 2012-04-25 MED ORDER — MEPERIDINE HCL 25 MG/ML IJ SOLN: 6.2500 mg | INTRAMUSCULAR | Status: DC | PRN

## 2012-04-25 MED ORDER — SIMETHICONE 80 MG PO CHEW
80.0000 mg | CHEWABLE_TABLET | ORAL | Status: DC | PRN
  Administered 2012-05-01: 80 mg via ORAL

## 2012-04-25 MED ORDER — MORPHINE SULFATE (PF) 0.5 MG/ML IJ SOLN
INTRAMUSCULAR | Status: DC | PRN
  Administered 2012-04-25: 3 mg via EPIDURAL

## 2012-04-25 MED ORDER — BISACODYL 10 MG RE SUPP
10.0000 mg | Freq: Every day | RECTAL | Status: DC | PRN
  Administered 2012-05-01: 10 mg via RECTAL
  Filled 2012-04-25 (×2): qty 1

## 2012-04-25 MED ORDER — BUPIVACAINE HCL (PF) 0.25 % IJ SOLN
INTRAMUSCULAR | Status: AC
  Filled 2012-04-25: qty 30

## 2012-04-25 MED ORDER — LABETALOL HCL 300 MG PO TABS
300.0000 mg | ORAL_TABLET | Freq: Three times a day (TID) | ORAL | Status: DC
  Administered 2012-04-25 (×2): 300 mg via ORAL
  Filled 2012-04-25 (×5): qty 1

## 2012-04-25 MED ORDER — ACETAMINOPHEN 10 MG/ML IV SOLN: 1000.0000 mg | Freq: Four times a day (QID) | INTRAVENOUS | Status: DC | PRN

## 2012-04-25 MED ORDER — PROMETHAZINE HCL 25 MG/ML IJ SOLN: 6.2500 mg | INTRAMUSCULAR | Status: DC | PRN

## 2012-04-25 MED ORDER — ONDANSETRON HCL 4 MG/2ML IJ SOLN
INTRAMUSCULAR | Status: DC | PRN
  Administered 2012-04-25: 4 mg via INTRAVENOUS

## 2012-04-25 MED ORDER — DIPHENHYDRAMINE HCL 50 MG/ML IJ SOLN: 25.0000 mg | INTRAMUSCULAR | Status: DC | PRN

## 2012-04-25 MED ORDER — SENNOSIDES-DOCUSATE SODIUM 8.6-50 MG PO TABS
2.0000 | ORAL_TABLET | Freq: Every day | ORAL | Status: DC
  Administered 2012-04-25 – 2012-05-01 (×5): 2 via ORAL

## 2012-04-25 MED ORDER — FERROUS SULFATE 325 (65 FE) MG PO TABS
325.0000 mg | ORAL_TABLET | Freq: Two times a day (BID) | ORAL | Status: DC
  Administered 2012-04-25 – 2012-05-01 (×5): 325 mg via ORAL
  Filled 2012-04-25 (×9): qty 1

## 2012-04-25 MED ORDER — DIPHENHYDRAMINE HCL 50 MG/ML IJ SOLN: 12.5000 mg | INTRAMUSCULAR | Status: DC | PRN

## 2012-04-25 MED ORDER — ONDANSETRON HCL 4 MG/2ML IJ SOLN
4.0000 mg | Freq: Three times a day (TID) | INTRAMUSCULAR | Status: DC | PRN
  Administered 2012-04-27: 4 mg via INTRAVENOUS

## 2012-04-25 MED ORDER — ZOLPIDEM TARTRATE 5 MG PO TABS: 5.0000 mg | ORAL_TABLET | Freq: Every evening | ORAL | Status: DC | PRN

## 2012-04-25 MED ORDER — METHYLENE BLUE 1 % INJ SOLN
INTRAMUSCULAR | Status: AC
  Filled 2012-04-25: qty 1

## 2012-04-25 MED ORDER — LACTATED RINGERS IV SOLN
INTRAVENOUS | Status: DC | PRN
  Administered 2012-04-25 (×2): via INTRAVENOUS

## 2012-04-25 MED ORDER — OXYTOCIN 10 UNIT/ML IJ SOLN
40.0000 [IU] | INTRAVENOUS | Status: DC | PRN
  Administered 2012-04-25: 40 [IU] via INTRAVENOUS

## 2012-04-25 MED ORDER — TETANUS-DIPHTH-ACELL PERTUSSIS 5-2.5-18.5 LF-MCG/0.5 IM SUSP
0.5000 mL | Freq: Once | INTRAMUSCULAR | Status: AC
  Administered 2012-04-28: 0.5 mL via INTRAMUSCULAR
  Filled 2012-04-25 (×2): qty 0.5

## 2012-04-25 MED ORDER — LANOLIN HYDROUS EX OINT: 1.0000 "application " | TOPICAL_OINTMENT | CUTANEOUS | Status: DC | PRN

## 2012-04-25 MED ORDER — METOCLOPRAMIDE HCL 5 MG/ML IJ SOLN: 10.0000 mg | Freq: Three times a day (TID) | INTRAMUSCULAR | Status: DC | PRN

## 2012-04-25 MED ORDER — SODIUM CHLORIDE 0.9 % IV SOLN: 250.0000 mL | INTRAVENOUS | Status: DC

## 2012-04-25 MED ORDER — SODIUM BICARBONATE 8.4 % IV SOLN
INTRAVENOUS | Status: DC | PRN
  Administered 2012-04-25: 3 mL via EPIDURAL

## 2012-04-25 MED ORDER — MUPIROCIN 2 % EX OINT
TOPICAL_OINTMENT | Freq: Two times a day (BID) | CUTANEOUS | Status: DC
  Administered 2012-04-25: 1 via NASAL

## 2012-04-25 MED ORDER — FENTANYL CITRATE 0.05 MG/ML IJ SOLN
INTRAMUSCULAR | Status: AC
  Filled 2012-04-25: qty 2

## 2012-04-25 MED ORDER — NALOXONE HCL 0.4 MG/ML IJ SOLN: 0.4000 mg | INTRAMUSCULAR | Status: DC | PRN

## 2012-04-25 MED ORDER — SODIUM CHLORIDE 0.9 % IJ SOLN: 3.0000 mL | INTRAMUSCULAR | Status: DC | PRN

## 2012-04-25 MED ORDER — SCOPOLAMINE 1 MG/3DAYS TD PT72
1.0000 | MEDICATED_PATCH | Freq: Once | TRANSDERMAL | Status: DC
  Administered 2012-04-25: 1.5 mg via TRANSDERMAL

## 2012-04-25 MED ORDER — MIDAZOLAM HCL 5 MG/5ML IJ SOLN
INTRAMUSCULAR | Status: DC | PRN
  Administered 2012-04-25 (×2): 1 mg via INTRAVENOUS

## 2012-04-25 MED ORDER — DEXTROSE IN LACTATED RINGERS 5 % IV SOLN
INTRAVENOUS | Status: DC
  Administered 2012-04-26 – 2012-04-27 (×3): via INTRAVENOUS

## 2012-04-25 MED ORDER — MORPHINE SULFATE (PF) 0.5 MG/ML IJ SOLN
INTRAMUSCULAR | Status: DC | PRN
  Administered 2012-04-25: 1 mg via INTRAVENOUS
  Administered 2012-04-25: 1 mg via EPIDURAL

## 2012-04-25 MED ORDER — SCOPOLAMINE 1 MG/3DAYS TD PT72: 1.0000 | MEDICATED_PATCH | Freq: Once | TRANSDERMAL | Status: DC

## 2012-04-25 MED ORDER — DEXTROSE IN LACTATED RINGERS 5 % IV SOLN: INTRAVENOUS | Status: DC

## 2012-04-25 MED ORDER — INDIGOTINDISULFONATE SODIUM 8 MG/ML IJ SOLN
INTRAMUSCULAR | Status: DC | PRN
  Administered 2012-04-25: 40 mg via INTRAVENOUS

## 2012-04-25 MED ORDER — LACTATED RINGERS IV SOLN
INTRAVENOUS | Status: DC
  Administered 2012-04-25: 09:00:00 via INTRAVENOUS

## 2012-04-25 MED ORDER — ONDANSETRON HCL 4 MG/2ML IJ SOLN: 4.0000 mg | INTRAMUSCULAR | Status: DC | PRN

## 2012-04-25 MED ORDER — INDIGOTINDISULFONATE SODIUM 8 MG/ML IJ SOLN
INTRAMUSCULAR | Status: AC
  Filled 2012-04-25: qty 5

## 2012-04-25 MED ORDER — MORPHINE SULFATE 0.5 MG/ML IJ SOLN
INTRAMUSCULAR | Status: AC
  Filled 2012-04-25: qty 10

## 2012-04-25 MED ORDER — BUPIVACAINE HCL (PF) 0.25 % IJ SOLN
INTRAMUSCULAR | Status: DC | PRN
  Administered 2012-04-25: 10 mL

## 2012-04-25 MED ORDER — FENTANYL CITRATE 0.05 MG/ML IJ SOLN: 25.0000 ug | INTRAMUSCULAR | Status: DC | PRN

## 2012-04-25 MED ORDER — SODIUM CHLORIDE 0.9 % IV SOLN
INTRAVENOUS | Status: DC
  Administered 2012-04-25: 13:00:00 via EPIDURAL
  Filled 2012-04-25 (×5): qty 20

## 2012-04-25 MED ORDER — NALBUPHINE HCL 10 MG/ML IJ SOLN
5.0000 mg | INTRAMUSCULAR | Status: DC | PRN
  Filled 2012-04-25: qty 1

## 2012-04-25 MED ORDER — MIDAZOLAM HCL 2 MG/2ML IJ SOLN: 0.5000 mg | Freq: Once | INTRAMUSCULAR | Status: DC | PRN

## 2012-04-25 MED ORDER — FENTANYL CITRATE 0.05 MG/ML IJ SOLN
INTRAMUSCULAR | Status: DC | PRN
  Administered 2012-04-25 (×5): 25 ug via INTRAVENOUS

## 2012-04-25 MED ORDER — CEFAZOLIN SODIUM-DEXTROSE 2-3 GM-% IV SOLR: 2.0000 g | INTRAVENOUS | Status: DC

## 2012-04-25 MED ORDER — SODIUM CHLORIDE 0.9 % IJ SOLN
3.0000 mL | Freq: Two times a day (BID) | INTRAMUSCULAR | Status: DC
  Administered 2012-04-26 – 2012-05-02 (×4): 3 mL via INTRAVENOUS

## 2012-04-25 MED ORDER — ONDANSETRON HCL 4 MG PO TABS: 4.0000 mg | ORAL_TABLET | ORAL | Status: DC | PRN

## 2012-04-25 MED ORDER — DIPHENHYDRAMINE HCL 25 MG PO CAPS
25.0000 mg | ORAL_CAPSULE | Freq: Four times a day (QID) | ORAL | Status: DC | PRN
  Filled 2012-04-25: qty 1

## 2012-04-25 MED ORDER — MENTHOL 3 MG MT LOZG
1.0000 | LOZENGE | OROMUCOSAL | Status: DC | PRN
  Administered 2012-04-26: 3 mg via ORAL
  Filled 2012-04-25 (×2): qty 9

## 2012-04-25 MED ORDER — CEFAZOLIN SODIUM 1-5 GM-% IV SOLN
1.0000 g | Freq: Three times a day (TID) | INTRAVENOUS | Status: DC
  Administered 2012-04-25 – 2012-05-02 (×21): 1 g via INTRAVENOUS
  Filled 2012-04-25 (×22): qty 50

## 2012-04-25 MED ORDER — EPHEDRINE SULFATE 50 MG/ML IJ SOLN
INTRAMUSCULAR | Status: DC | PRN
  Administered 2012-04-25: 10 mg via INTRAVENOUS
  Administered 2012-04-25: 5 mg via INTRAVENOUS
  Administered 2012-04-25: 10 mg via INTRAVENOUS

## 2012-04-25 MED ORDER — NALBUPHINE HCL 10 MG/ML IJ SOLN: 5.0000 mg | INTRAMUSCULAR | Status: DC | PRN

## 2012-04-25 MED ORDER — DIPHENHYDRAMINE HCL 25 MG PO CAPS: 25.0000 mg | ORAL_CAPSULE | ORAL | Status: DC | PRN

## 2012-04-25 MED ORDER — SODIUM CHLORIDE 0.9 % IV SOLN
1.0000 ug/kg/h | INTRAVENOUS | Status: DC | PRN
  Filled 2012-04-25: qty 2.5

## 2012-04-25 MED ORDER — NIFEDIPINE ER 30 MG PO TB24
30.0000 mg | ORAL_TABLET | Freq: Every day | ORAL | Status: DC
  Administered 2012-04-25: 30 mg via ORAL
  Filled 2012-04-25 (×3): qty 1

## 2012-04-25 MED ORDER — MIDAZOLAM HCL 2 MG/2ML IJ SOLN
INTRAMUSCULAR | Status: AC
  Filled 2012-04-25: qty 2

## 2012-04-25 MED ORDER — DIPHENHYDRAMINE HCL 25 MG PO CAPS
25.0000 mg | ORAL_CAPSULE | ORAL | Status: DC | PRN
  Filled 2012-04-25: qty 1

## 2012-04-25 MED ORDER — CEFAZOLIN SODIUM-DEXTROSE 2-3 GM-% IV SOLR
INTRAVENOUS | Status: AC
  Administered 2012-04-25: 2 g via INTRAVENOUS
  Filled 2012-04-25: qty 50

## 2012-04-25 MED ORDER — KETOROLAC TROMETHAMINE 30 MG/ML IJ SOLN: 30.0000 mg | Freq: Four times a day (QID) | INTRAMUSCULAR | Status: DC | PRN

## 2012-04-25 MED ORDER — OXYTOCIN 40 UNITS IN LACTATED RINGERS INFUSION - SIMPLE MED: 62.5000 mL/h | INTRAVENOUS | Status: AC

## 2012-04-25 MED ORDER — OXYCODONE-ACETAMINOPHEN 5-325 MG PO TABS
1.0000 | ORAL_TABLET | ORAL | Status: DC | PRN
  Administered 2012-04-26 (×2): 2 via ORAL
  Filled 2012-04-25 (×2): qty 2

## 2012-04-25 MED ORDER — BUPIVACAINE-EPINEPHRINE (PF) 0.5% -1:200000 IJ SOLN
INTRAMUSCULAR | Status: AC
  Filled 2012-04-25: qty 10

## 2012-04-25 MED ORDER — ONDANSETRON HCL 4 MG/2ML IJ SOLN
4.0000 mg | Freq: Three times a day (TID) | INTRAMUSCULAR | Status: DC | PRN
  Filled 2012-04-25: qty 2

## 2012-04-25 MED ORDER — SODIUM CHLORIDE 0.9 % IV SOLN: 1.0000 ug/kg/h | INTRAVENOUS | Status: DC | PRN

## 2012-04-25 MED ORDER — ACETAMINOPHEN 10 MG/ML IV SOLN
1000.0000 mg | Freq: Four times a day (QID) | INTRAVENOUS | Status: AC | PRN
  Filled 2012-04-25: qty 100

## 2012-04-25 MED ORDER — MUPIROCIN 2 % EX OINT
TOPICAL_OINTMENT | CUTANEOUS | Status: AC
  Administered 2012-04-25: 1 via NASAL
  Filled 2012-04-25: qty 22

## 2012-04-25 MED ORDER — FLEET ENEMA 7-19 GM/118ML RE ENEM: 1.0000 | ENEMA | Freq: Every day | RECTAL | Status: DC | PRN

## 2012-04-25 MED ORDER — SIMETHICONE 80 MG PO CHEW
80.0000 mg | CHEWABLE_TABLET | Freq: Three times a day (TID) | ORAL | Status: DC
  Administered 2012-04-25 – 2012-05-02 (×14): 80 mg via ORAL

## 2012-04-25 MED ORDER — DIBUCAINE 1 % RE OINT
1.0000 "application " | TOPICAL_OINTMENT | RECTAL | Status: DC | PRN
  Filled 2012-04-25: qty 28

## 2012-04-25 MED ORDER — WITCH HAZEL-GLYCERIN EX PADS: 1.0000 "application " | MEDICATED_PAD | CUTANEOUS | Status: DC | PRN

## 2012-04-25 MED ORDER — SCOPOLAMINE 1 MG/3DAYS TD PT72
MEDICATED_PATCH | TRANSDERMAL | Status: AC
  Administered 2012-04-25: 1.5 mg via TRANSDERMAL
  Filled 2012-04-25: qty 1

## 2012-04-25 MED ORDER — PHENYLEPHRINE HCL 10 MG/ML IJ SOLN
INTRAMUSCULAR | Status: DC | PRN
  Administered 2012-04-25 (×5): 80 ug via INTRAVENOUS
  Administered 2012-04-25 (×2): 120 ug via INTRAVENOUS
  Administered 2012-04-25: 80 ug via INTRAVENOUS
  Administered 2012-04-25: 120 ug via INTRAVENOUS
  Administered 2012-04-25: 80 ug via INTRAVENOUS
  Administered 2012-04-25: 40 ug via INTRAVENOUS
  Administered 2012-04-25 (×2): 120 ug via INTRAVENOUS

## 2012-04-25 MED ORDER — PRENATAL MULTIVITAMIN CH
1.0000 | ORAL_TABLET | Freq: Every day | ORAL | Status: DC
  Administered 2012-04-25 – 2012-05-02 (×5): 1 via ORAL
  Filled 2012-04-25 (×8): qty 1

## 2012-04-25 SURGICAL SUPPLY — 67 items
BARRIER ADHS 3X4 INTERCEED (GAUZE/BANDAGES/DRESSINGS) ×4 IMPLANT
BLADE SURG 10 STRL SS (BLADE) ×4 IMPLANT
BRR ADH 4X3 ABS CNTRL BYND (GAUZE/BANDAGES/DRESSINGS) ×3
CANISTER SUCTION 2500CC (MISCELLANEOUS) ×4 IMPLANT
CATH FOLEY 2WAY  3CC  8FR (CATHETERS)
CATH FOLEY 2WAY 3CC 8FR (CATHETERS) IMPLANT
CLOTH BEACON ORANGE TIMEOUT ST (SAFETY) ×4 IMPLANT
CONT PATH 16OZ SNAP LID 3702 (MISCELLANEOUS) ×4 IMPLANT
CONT SPEC PATH 64OZ SNAP LID (MISCELLANEOUS) ×4 IMPLANT
CONTAINER PREFILL 10% NBF 15ML (MISCELLANEOUS) ×8 IMPLANT
DECANTER SPIKE VIAL GLASS SM (MISCELLANEOUS) ×4 IMPLANT
DRAPE CESAREAN BIRTH W POUCH (DRAPES) ×4 IMPLANT
DRESSING TELFA 8X3 (GAUZE/BANDAGES/DRESSINGS) ×4 IMPLANT
ELECT NEEDLE TIP 2.8 STRL (NEEDLE) ×4 IMPLANT
ELECT REM PT RETURN 9FT ADLT (ELECTROSURGICAL) ×4
ELECTRODE REM PT RTRN 9FT ADLT (ELECTROSURGICAL) ×3 IMPLANT
FILTER STRAW FLUID ASPIR (MISCELLANEOUS) IMPLANT
GAUZE SPONGE 4X4 12PLY STRL LF (GAUZE/BANDAGES/DRESSINGS) ×4 IMPLANT
GAUZE SPONGE 4X4 16PLY XRAY LF (GAUZE/BANDAGES/DRESSINGS) ×4 IMPLANT
GLOVE BIO SURGEON STRL SZ 6.5 (GLOVE) ×4 IMPLANT
GLOVE BIOGEL PI IND STRL 7.0 (GLOVE) ×6 IMPLANT
GLOVE BIOGEL PI INDICATOR 7.0 (GLOVE) ×2
GOWN PREVENTION PLUS LG XLONG (DISPOSABLE) ×12 IMPLANT
HEMOSTAT SURGICEL 4X8 (HEMOSTASIS) ×4 IMPLANT
IV STOPCOCK 4 WAY 40  W/Y SET (IV SOLUTION)
IV STOPCOCK 4 WAY 40 W/Y SET (IV SOLUTION) IMPLANT
KIT ABG SYR 3ML LUER SLIP (SYRINGE) ×4 IMPLANT
NEEDLE HYPO 25X1 1.5 SAFETY (NEEDLE) ×4 IMPLANT
NEEDLE HYPO 25X5/8 SAFETYGLIDE (NEEDLE) ×4 IMPLANT
NEEDLE SPNL 22GX9.0 ACCUTG (NEEDLE) IMPLANT
NS IRRIG 1000ML POUR BTL (IV SOLUTION) ×4 IMPLANT
PACK ABDOMINAL GYN (CUSTOM PROCEDURE TRAY) ×4 IMPLANT
PACK C SECTION WH (CUSTOM PROCEDURE TRAY) ×4 IMPLANT
PAD ABD 7.5X8 STRL (GAUZE/BANDAGES/DRESSINGS) ×4 IMPLANT
PAD OB MATERNITY 4.3X12.25 (PERSONAL CARE ITEMS) ×4 IMPLANT
SPONGE LAP 18X18 X RAY DECT (DISPOSABLE) ×8 IMPLANT
STAPLER VISISTAT 35W (STAPLE) ×4 IMPLANT
SUT CHROMIC GUT AB #0 18 (SUTURE) ×4 IMPLANT
SUT MNCRL 0 VIOLET CTX 36 (SUTURE) ×9 IMPLANT
SUT MNCRL AB 4-0 PS2 18 (SUTURE) ×8 IMPLANT
SUT MON AB 2-0 SH 27 (SUTURE) ×12
SUT MON AB 2-0 SH27 (SUTURE) ×12 IMPLANT
SUT MON AB 3-0 SH 27 (SUTURE) ×4
SUT MON AB 3-0 SH27 (SUTURE) ×3 IMPLANT
SUT MON AB 4-0 PS1 27 (SUTURE) IMPLANT
SUT MONOCRYL 0 CTX 36 (SUTURE) ×3
SUT PLAIN 2 0 (SUTURE)
SUT PLAIN ABS 2-0 CT1 27XMFL (SUTURE) IMPLANT
SUT VIC AB 0 CT1 18XCR BRD8 (SUTURE) ×9 IMPLANT
SUT VIC AB 0 CT1 27 (SUTURE) ×15
SUT VIC AB 0 CT1 27XBRD ANBCTR (SUTURE) ×15 IMPLANT
SUT VIC AB 0 CT1 36 (SUTURE) ×8 IMPLANT
SUT VIC AB 0 CT1 8-18 (SUTURE) ×12
SUT VIC AB 2-0 CT1 27 (SUTURE) ×4
SUT VIC AB 2-0 CT1 TAPERPNT 27 (SUTURE) ×3 IMPLANT
SUT VIC AB 2-0 SH 27 (SUTURE) ×16
SUT VIC AB 2-0 SH 27XBRD (SUTURE) ×12 IMPLANT
SUT VIC AB 3-0 SH 27 (SUTURE) ×3
SUT VIC AB 3-0 SH 27X BRD (SUTURE) ×3 IMPLANT
SUT VIC AB 4-0 PS2 27 (SUTURE) IMPLANT
SUT VICRYL 0 TIES 12 18 (SUTURE) IMPLANT
SYR 50ML LL SCALE MARK (SYRINGE) IMPLANT
SYR CONTROL 10ML LL (SYRINGE) ×4 IMPLANT
TAPE CLOTH SURG 4X10 WHT LF (GAUZE/BANDAGES/DRESSINGS) ×4 IMPLANT
TOWEL OR 17X24 6PK STRL BLUE (TOWEL DISPOSABLE) ×8 IMPLANT
TRAY FOLEY CATH 14FR (SET/KITS/TRAYS/PACK) ×4 IMPLANT
WATER STERILE IRR 1000ML POUR (IV SOLUTION) ×4 IMPLANT

## 2012-04-25 NOTE — Anesthesia Postprocedure Evaluation (Signed)
Anesthesia Post Note  Patient: [name]  Procedure(s) Performed: Procedure(s) (LRB): CESAREAN SECTION WITH BILATERAL TUBAL LIGATION (Bilateral) MYOMECTOMY (N/A) CYSTOSCOPY (N/A)  Anesthesia type: Epidural  Patient location: PACU  Post pain: Pain level controlled  Post assessment: Post-op Vital signs reviewed  Last Vitals:  Filed Vitals:   04/25/12 1345  BP:   Pulse:   Temp: 36.4 C  Resp:     Post vital signs: Reviewed  Level of consciousness: awake  Complications: No apparent anesthesia complications

## 2012-04-25 NOTE — Progress Notes (Signed)
Spoke to pt in AICU room regarding earlier MD accidental exposure pre-op. Pt denies risk factors for blood-borne disease. Will have blood re-drawn for exposure panel. Pt voices understanding.

## 2012-04-25 NOTE — Anesthesia Procedure Notes (Signed)
Epidural Patient location during procedure: OB Start time: 04/25/2012 9:32 AM  Staffing Anesthesiologist: Brayton Caves R Performed by: anesthesiologist   Preanesthetic Checklist Completed: patient identified, site marked, surgical consent, pre-op evaluation, timeout performed, IV checked, risks and benefits discussed and monitors and equipment checked  Epidural Patient position: sitting Prep: site prepped and draped and DuraPrep Patient monitoring: continuous pulse ox and blood pressure Approach: midline Injection technique: LOR air  Needle:  Needle type: Tuohy  Needle gauge: 17 G Needle length: 9 cm Needle insertion depth: 4 cm Catheter type: closed end flexible Catheter size: 19 Gauge Catheter at skin depth: 9 cm Test dose: negative  Assessment Events: blood not aspirated, injection not painful, no injection resistance, negative IV test and no paresthesia  Additional Notes Patient identified.  Risk benefits discussed including failed block, incomplete pain control, headache, nerve damage, paralysis, blood pressure changes, nausea, vomiting, reactions to medication both toxic or allergic, and postpartum back pain.  Patient expressed understanding and wished to proceed.  All questions were answered.  Sterile technique used throughout procedure and epidural site dressed with sterile barrier dressing. No paresthesia or other complications noted.The patient did not experience any signs of intravascular injection such as tinnitus or metallic taste in mouth nor signs of intrathecal spread such as rapid motor block. Please see nursing notes for vital signs.

## 2012-04-25 NOTE — Anesthesia Preprocedure Evaluation (Addendum)
Anesthesia Evaluation  Patient identified by MRN, date of birth, ID band Patient awake    Reviewed: Allergy & Precautions, H&P , NPO status , Patient's Chart, lab work & pertinent test results  Airway Mallampati: II      Dental No notable dental hx.    Pulmonary neg pulmonary ROS,  breath sounds clear to auscultation  Pulmonary exam normal       Cardiovascular Exercise Tolerance: Good hypertension, negative cardio ROS  Rhythm:regular Rate:Normal     Neuro/Psych negative neurological ROS  negative psych ROS   GI/Hepatic negative GI ROS, Neg liver ROS,   Endo/Other  negative endocrine ROS  Renal/GU negative Renal ROS  negative genitourinary   Musculoskeletal   Abdominal Normal abdominal exam  (+)   Peds  Hematology negative hematology ROS (+)   Anesthesia Other Findings CIN I (cervical intraepithelial neoplasia I)     Fibroid        Elevated cholesterol     Hypertension    Reproductive/Obstetrics (+) Pregnancy                           Anesthesia Physical Anesthesia Plan  ASA: II  Anesthesia Plan: Epidural   Post-op Pain Management:    Induction:   Airway Management Planned:   Additional Equipment:   Intra-op Plan:   Post-operative Plan:   Informed Consent: I have reviewed the patients History and Physical, chart, labs and discussed the procedure including the risks, benefits and alternatives for the proposed anesthesia with the patient or authorized representative who has indicated his/her understanding and acceptance.     Plan Discussed with: Anesthesiologist, CRNA and Surgeon  Anesthesia Plan Comments:        Anesthesia Quick Evaluation

## 2012-04-25 NOTE — H&P (Signed)
[name] is a 46 y.o. female hx C/S x 4  presenting @ 34 3/[redacted] weeks gestation for repeat C/S due to IUGR. Patient has been followed by MFM who now recommends delivery. PNC complicated by cell free DNA (+) down syndrome( pt declined amnio). elev risk with ultrascreen. Pt also has chronic HTN  controlled on procardia XL and labetalol and uterine fibroid. Fetus was followed for ventriculomegaly which resolved. She had fetal echo with ? VSD.  Pt has polyhydramnios. Pt has known large posterior cul de sac fibroid displacing cervix under pubic bone. Pt desires permanent sterilization. Maternal Medical History:  Reason for admission: Reason for admission: contractions.  Contractions: Frequency: irregular.   Perceived severity is mild.    Fetal activity: Perceived fetal activity is normal.    Prenatal complications: Hypertension, IUGR and polyhydramnios.     OB History    Grav Para Term Preterm Abortions TAB SAB Ect Mult Living   6 2 2  3 1 2   2      Past Medical History  Diagnosis Date  . CIN I (cervical intraepithelial neoplasia I)   . Fibroid   . Elevated cholesterol   . Hypertension    Past Surgical History  Procedure Date  . Cesarean section     X3  . Colposcopy    Family History: family history includes Diabetes in her maternal grandmother; Heart disease in her paternal grandfather; Hypertension in her father and mother; and Stroke in her father. Social History:  reports that she has never smoked. She does not have any smokeless tobacco history on file. She reports that she does not drink alcohol. Her drug history not on file.   Prenatal Transfer Tool  Maternal Diabetes: No Genetic Screening: Abnormal:  Results: Elevated risk of Trisomy 21 Maternal Ultrasounds/Referrals: Abnormal:  Findings:   IUGR Fetal Ultrasounds or other Referrals:  Fetal echo Maternal Substance Abuse:  No Significant Maternal Medications:  Meds include: Other: see prenatal record Significant Maternal  Lab Results:  Lab values include: Other: see prenatal record Other Comments:  BMZ complete  Review of Systems  All other systems reviewed and are negative.      Blood pressure 151/96, pulse 76, temperature 98.4 F (36.9 C), temperature source Oral, resp. rate 18, last menstrual period 07/26/2011, SpO2 100.00%. Exam Physical Exam  Constitutional: She is oriented to person, place, and time. She appears well-developed and well-nourished.  HENT:  Head: Atraumatic.  Eyes: EOM are normal.  Neck: Neck supple.  Cardiovascular: Regular rhythm.   Respiratory: Breath sounds normal.  GI: Soft.  Musculoskeletal: Normal range of motion. She exhibits no edema.  Neurological: She is alert and oriented to person, place, and time.  Skin: Skin is warm and dry.  Psychiatric: She has a normal mood and affect.    Prenatal labs: ABO, Rh: --/--/A POS (06/26 1525) Antibody: NEG (06/26 1525) Rubella:  [place] RPR: Nonreactive (06/19 0000)  HBsAg:   neg HIV: Non-reactive (06/19 0000)  GBS:   unknown  Assessment/Plan: IUGR Chronic HTN Increased DS risk IUP @ 34 3/7 wks BMZ complete Desires Sterilization Polyhydramnios Previous C/S P) admit routine labs. Rpt c/S, TL, poss myomectomy. Risk of surgery reviewed: infection, bleeding, poss blood transfusion, internal scar tissue, nonreversible, failure rate 1/300, injury to surrounding organ structures. Cont HTn meds All ? answered  [name] A 04/25/2012, 9:21 AM

## 2012-04-25 NOTE — Transfer of Care (Signed)
Immediate Anesthesia Transfer of Care Note  Patient: Narda Bonds  Procedure(s) Performed: Procedure(s) (LRB): CESAREAN SECTION WITH BILATERAL TUBAL LIGATION (Bilateral) MYOMECTOMY (N/A) CYSTOSCOPY (N/A)  Patient Location: PACU  Anesthesia Type: Epidural  Level of Consciousness: awake and sedated  Airway & Oxygen Therapy: Patient Spontanous Breathing and Patient connected to nasal cannula oxygen  Post-op Assessment: Report given to PACU RN and Post -op Vital signs reviewed and stable  Post vital signs: Reviewed and stable  Complications: No apparent anesthesia complications

## 2012-04-25 NOTE — Brief Op Note (Signed)
04/25/2012  12:24 PM  PATIENT:  [name]  46 y.o. female  PRE-OPERATIVE DIAGNOSIS: IUGR, IUP @ 34 3/7 week, previous Cesarean section, Fibroid uterus, Polyhydramnios  POST-OPERATIVE DIAGNOSIS: IUGR, IUP@ 34 3/7 weeks,  Previous C-Section, Fibroid uterus, Polyhydramnios, Bladder laceration, Acute blood loss  PROCEDURE:  Procedure(s) (LRB):REPEAT CESAREAN SECTION, CLASSICAL INCISION WITH BILATERAL TUBAL LIGATION (Bilateral) MYOMECTOMY (N/A) REPAIR OF BLADDER LACERATION,  CYSTOSCOPY (N/A)  SURGEON:  Surgeon(s) and Role:    * Kalesha Irving A Viviana Trimble, MD - Primary  PHYSICIAN ASSISTANT:   ASSISTANTS: none   ANESTHESIA:   epidural FINDINGS;  Live female transverse lie back up delivered as a breech, nl left tube and ovary, right tube distal end adherent to large posterior pedunculated fibroid w/ upper stalk posterior fundal. Fibroid behind promontory and into posterior cul de sac with some  , nl ovaries, multiple other smaller fibroids, bladder adherent to ant abd wall. Ureteral jets noted bilateral. Vertical entrance into bladder . With cystoscopy however, separate opening in the bladder noted as well as surface separation with thin bladder serosa noted EBL:  Total I/O In: 9300 [I.V.:7900; Blood:1400] Out: 3700 [Urine:1200; Blood:2500]  BLOOD ADMINISTERED:4 units CC PRBC  DRAINS: none   LOCAL MEDICATIONS USED:  MARCAINE     SPECIMEN:  Source of Specimen:  Placenta, portion of both fallopian tubes, large myoma  DISPOSITION OF SPECIMEN:  PATHOLOGY  COUNTS:  YES  TOURNIQUET:  * No tourniquets in log *  DICTATION: .Other Dictation: Dictation Number   PLAN OF CARE: Admit to inpatient   PATIENT DISPOSITION:  PACU - hemodynamically stable. baby to NICU   Delay start of Pharmacological VTE agent (>24hrs) due to surgical blood loss or risk of bleeding: no

## 2012-04-26 ENCOUNTER — Encounter (HOSPITAL_COMMUNITY): Payer: Self-pay | Admitting: Obstetrics and Gynecology

## 2012-04-26 LAB — COMPREHENSIVE METABOLIC PANEL
ALT: 20 U/L (ref 0–35)
AST: 39 U/L — ABNORMAL HIGH (ref 0–37)
Albumin: 2.6 g/dL — ABNORMAL LOW (ref 3.5–5.2)
CO2: 28 mEq/L (ref 19–32)
Calcium: 8.5 mg/dL (ref 8.4–10.5)
Creatinine, Ser: 0.47 mg/dL — ABNORMAL LOW (ref 0.50–1.10)
Sodium: 139 mEq/L (ref 135–145)
Total Protein: 4.9 g/dL — ABNORMAL LOW (ref 6.0–8.3)

## 2012-04-26 LAB — FIBRINOGEN: Fibrinogen: 323 mg/dL (ref 204–475)

## 2012-04-26 LAB — PREPARE FRESH FROZEN PLASMA
Unit division: 0
Unit division: 0

## 2012-04-26 LAB — CBC
HCT: 22.9 % — ABNORMAL LOW (ref 36.0–46.0)
Hemoglobin: 8 g/dL — ABNORMAL LOW (ref 12.0–15.0)
MCH: 29.8 pg (ref 26.0–34.0)
MCHC: 35.4 g/dL (ref 30.0–36.0)
MCV: 84.2 fL (ref 78.0–100.0)
MCV: 84.2 fL (ref 78.0–100.0)
Platelets: 77 10*3/uL — ABNORMAL LOW (ref 150–400)
RBC: 2.72 MIL/uL — ABNORMAL LOW (ref 3.87–5.11)
RBC: 3.42 MIL/uL — ABNORMAL LOW (ref 3.87–5.11)
RDW: 14.6 % (ref 11.5–15.5)
WBC: 10.4 10*3/uL (ref 4.0–10.5)

## 2012-04-26 LAB — PHOSPHORUS: Phosphorus: 2.7 mg/dL (ref 2.3–4.6)

## 2012-04-26 LAB — PROTIME-INR: INR: 1.02 (ref 0.00–1.49)

## 2012-04-26 LAB — APTT: aPTT: 27 seconds (ref 24–37)

## 2012-04-26 MED ORDER — ACETAMINOPHEN 325 MG PO TABS
650.0000 mg | ORAL_TABLET | Freq: Once | ORAL | Status: AC
  Administered 2012-04-26: 650 mg via ORAL
  Filled 2012-04-26: qty 2

## 2012-04-26 MED ORDER — FUROSEMIDE 10 MG/ML IJ SOLN
10.0000 mg | Freq: Once | INTRAMUSCULAR | Status: AC
  Administered 2012-04-26: 10 mg via INTRAVENOUS
  Filled 2012-04-26: qty 2

## 2012-04-26 MED ORDER — HYDROMORPHONE HCL PF 1 MG/ML IJ SOLN
2.0000 mg | Freq: Once | INTRAMUSCULAR | Status: AC
  Administered 2012-04-27: 2 mg via INTRAVENOUS
  Filled 2012-04-26: qty 2

## 2012-04-26 MED ORDER — DIPHENHYDRAMINE HCL 25 MG PO CAPS
25.0000 mg | ORAL_CAPSULE | Freq: Once | ORAL | Status: AC
  Administered 2012-04-26: 25 mg via ORAL
  Filled 2012-04-26: qty 1

## 2012-04-26 MED ORDER — LABETALOL HCL 300 MG PO TABS
300.0000 mg | ORAL_TABLET | Freq: Two times a day (BID) | ORAL | Status: DC
  Filled 2012-04-26 (×2): qty 1

## 2012-04-26 MED ORDER — POTASSIUM CHLORIDE 10 MEQ/100ML IV SOLN
10.0000 meq | INTRAVENOUS | Status: AC
  Administered 2012-04-26: 10 meq via INTRAVENOUS
  Filled 2012-04-26 (×3): qty 100

## 2012-04-26 NOTE — Progress Notes (Signed)
POD #1 S/P rpt C/S, TL, cystotomy, myomectomy  S: Ambulated to NICU. C/o thirst. Denies flatus. No nausea min upper abdominal pain. (+) light headed w/ ambulation  O: VS: T98.9 BP 125/78 P86 RR 18  Lungs clear to A Extr(-) edema or calf tenderness Cor RRR grade 3/6 SEM Abd: distended (+) BS soft. Fundus diff to palp. Primary dressing d/c/i Pad scant lochia  CBC: hgb 8.0 hct 22.9 wbc 10.4 plt 78k   CBC( 5P) wbc 18.6K, hgb 11.8, hct 35. plt 115K( after 4 U PBC but only 1 Unit of FFP  Urine clear yellow. Yesterday @ 5:50 pm prior to the 4 additional units of FFP that had been ordered and only one given) urine was dark bloody which had been clear copious w/ methylene blue  In the OR> suspected low grade DIC at that point. Color improved after FFP infusion  IMP: Paralytic ileus  Thrombocytopenia due to prob low grade DIC. Will defer plt transfusion at this time.  Symptomatic anemia. Pt will give 2 units FFP and PRBC now Chronic HTN on med ( BP good) will watch to see if need to hold meds) Prev C/S( POD #!) Bladder cystotomy will need indwelling foley No evidence of ongoing intraop bleeding( not tachycardic)  P) change to sips of ice/chip/clear fluid. Ambulate w/ assist. Cont epidural for pain mgmt. Check CMP, CBC w/ post transfusion draw. Close I/O. Will start iron when oral intake fully. Check DIC panel to see if need cryo

## 2012-04-26 NOTE — Op Note (Signed)
NAMEKENYONA, RENA                ACCOUNT NO.:  [id number]  MEDICAL RECORD NO.:  [id number]  LOCATION:  9374                          FACILITY:  [facility]  PHYSICIAN:  Maxie Better, M.D.DATE OF BIRTH:  1965-09-28  DATE OF PROCEDURE:  04/25/2012 DATE OF DISCHARGE:                              OPERATIVE REPORT   PREOPERATIVE DIAGNOSES:  Intrauterine gestation at 5 and 3/7th weeks, intrauterine growth restriction,intrauterine gestation with possible Down syndrome baby chronic hypertension, fibroid uterus, desires sterilization, polyhydramnios, previous cesarean section x4.  PROCEDURE:  Repeat cesarean section, classical hysterotomy, left partial salpingectomy,  right distal salpingectomy, myomectomy, incidental cystotomy and cystoscopy with repair of bladder.  POSTOPERATIVE DIAGNOSES:  Fibroid uterus with large posterior pedunculated fibroid, intrauterine growth restriction, polyhydramnios, intrauterine gestation with possible Down syndrome baby, previous cesarean section x4, chronic hypertension. Bladder cystotomy,pelvic adhesion, desires sterilization, acute blood loss  ANESTHESIA:  Epidural.  SURGEON:  Maxie Better, MD.  ASSISTANT:  None.  PROCEDURE:  Under adequate epidural anesthesia, the patient was placed in supine position with left lateral tilt.  An indwelling Foley catheter was sterilely placed.  The patient was prepped and draped in usual fashion.  Marcaine 0.25% was injected along the previous Pfannenstiel skin incision site.  Pfannenstiel skin incision was made through previous scar and carried down to the rectus fascia.  The rectus fascia was opened transversely.The rectus fascia was bluntly and sharply dissected off the rectus muscle in a superior and inferior fashion. Dissection of what appears to be the rectus muscle was done superiorly and inferiorly.  At that point, the muscles being split in the midline. In doing so, it was then noted that the bladder had  been incidentally opened in a vertical fashion.  The bladder was then closed with 2 layers of 2-0 Vicryl sutures.  The dissection was then carried even further superiorly.  The peritoneum was subsequently opened superiorly.  The bladder had been high on the uterine and abdominal wall.  The lower uterine segment was undeveloped.  There was several fibroids noted.  The baby was in the transverse position.  The classical incision was then made on the uterus and subsequently extended with bandage scissor.  The fetus was identified in the intact amniotic sac in a transverse position with head to maternal left and back up. The feet was identified through the sac and the  baby was delivered as a breech presentation using the usual breech manuevers.  Cord was clamped and cut.  The baby was transferred to the awaiting pediatrician. Apgars were 5 and 8 at one and five minutes respectively. Baby was transferred to neonatal intensive care due to prematurity. The placenta was manually removed and sent to Pathology.  Uterine cavity was cleaned of debris.  With the limitation in exposure, the uterus was exteriorized but was restricted by large posterior fibroid with stalk noted posteriorly.  The uterine incision was then closed with 2 layers of 0 Monocryl running locked stitch.  This serosal area was not yet been approximated. Attention was then turned to the fallopian tubes.  Left tube and ovary could be seen.  The proximal portion of right tube could be seen but the fimbriated end was  not able to be seen as it was attached to posterior fibroid. Left ovary was normal. Inspection of the posterior fibroid showed it had a narrow stalk located posterior fundus that was pulling the uterine fundus posteriorly. The fibroid was located in the posterior cul de sac and  had some surrounding adhesion and focal area of bowel adhesion. It appear amenable to removal as agreed to by patient to pressure and pain symptoms during the  pregnancy.  The fibroid appeared to be incarcerated behind the sacral promontory and into the posterior cul-de-sac.  The right ovary was barely seen.  The left tube distal end could not be seen. On inspection it appeared to be attached to fibroid.  Decision was then made to exteriorize the uterus.  The pedunculated portion of the fibroid was noted posteriorly.  As the uterus was being exteriorized, the fibroid was seen with evidence of further adherent to the posterior cul-de-sac.  Tenaculum was placed on the fibroid to bring it up and bleeding from tearing through the fibroid resulted in decision made to remove it. The procedure was started with stalk being severed with cautery as well as the small amount of bowel adhesion being sharply dissected off the fibroid. The base of the prior stalk location was closed with 0-Monocryl suture and 3-0 Monocryl suture for the serosal surface. The fibroid then easily peeled off the posterior aspect of the uterus and was easily removed. The fimbriated end of the left fallopian tube was also freed from the fibroid and the left ovary seen and was normal. Bleeding from the distal end of the tube resulted in decision to place some 3-0 Vicryl sutures in the bleeding site just under the mesosalpinx of the tube on the right. Brisk bleeding was noted deep in the posterior cul de sac on the right side of pelvis. Suture ligation and cauterization was used to achieve hemostasis. Once hemostasis was achieved in the posterior cul-de-sac, surgicel was placed.  The uterus was returned back to the abdomen prior to performing tubal ligation, to avoid disrupting tubal surgery. The left midportion of the fallopian tube was grasped with a Babcock. The underlying mesosalpinx was opened with cautery.  The proximal and distal end of the tube was tied with 0 chromic sutures times two distally and proximally.  The intervening segment of left tube was removed.  Distal bleeding from the left tube  was again noted and resulted in decision made to perform a distal salpingectomy which was done without difficulty. The right tube was then suture ligated with 3-0 Vicryl suture. At that point, the paracolic gutters were cleaned of debris.  The remaining incision on the uterus was closed with 3-0 Monocryl baseball stitch, and the posterior stalk site had additional 3-0 Monocryl suture placement serosally for hemostasis.  Due to the fact that the bladder was high on the abdominal wall, decision was made not to close the parietal peritoneum. The repaired area of the bladder showed no leakage however the integrity of the bladder repair repair using cystoscopy was planned.  The patient was therefore placed in the [facility] stirrups.  Cystoscope was inserted and indigo carmine was given intravenously.  Immediately, there was clearly evidence of jet stream of urine from both sides.  However, there was copious amount of fluid coming through the incision abdominally.  The cystoscope was removed.  The patient was placed back in the supine position and the pelvis was again inspected.  The integrity of the actual cystotomy repair was still intact.  It was noted that there was another less than a centimeter bladder opening separate from the cystotomy repair on the left side and fluid was briskly leaking through it. It was also noted that there was areas of the bladder that was denuded on the surface with a very thin bladder wall was seen.  The opening was closed in 2 layers with 2-0 Vicryl sutures.The areas of weakness of the surface of the bladder was also closed with 2-0 Vicryl sutures, and the retrograde filling of the three-way Foley which was placed after the cystoscopy resulted in the bladder repairs to be well approximated.  Attention was then turned back to the abdomen.Fluid was removed.  Hemostasis was noted. Interceed was placed over the uterine incision.  The parietal peritoneum was not closed.  The rectus fascia  was closed with 0 Vicryl.  The patient was given intraoperatively 4 units of packed red cells.  The rectus fascia was then closed with 0 Vicryl x2.  The skin was approximated using Ethicon staples.  SPECIMEN:  The placenta sent to Pathology.  The portion of fallopian tubes sent to Pathology, and fibroid also sent to Pathology.  At the end of the case, the urine other than blue-green tinge from indigo carmine was otherwise appeared to be quite clear and copious.  Fresh frozen plasma 4 unit was ordered prior to closing the abdomen for the patient to the receive.  Pressure dressing was placed.  COMPLICATION:  Bladder cystotomy.  ESTIMATED BLOOD LOSS:  2500 mL.  INTRAOPERATIVE FLUID:  7900 mL.  4 units of packed red blood cells,.  URINE OUTPUT:  1200 mL.  COUNT:  Sponge and instrument counts were correct x 2.  The baby was transferred to the neonatal intensive care.  The patient was transferred to the recovery room in stable condition.     Maxie Better, M.D.     [place]/MEDQ  D:  04/26/2012  T:  04/26/2012  Job:  [id number]

## 2012-04-27 ENCOUNTER — Inpatient Hospital Stay (HOSPITAL_COMMUNITY): Payer: BC Managed Care – PPO

## 2012-04-27 LAB — COMPREHENSIVE METABOLIC PANEL WITH GFR
ALT: 19 U/L (ref 0–35)
AST: 38 U/L — ABNORMAL HIGH (ref 0–37)
Albumin: 2.3 g/dL — ABNORMAL LOW (ref 3.5–5.2)
Alkaline Phosphatase: 74 U/L (ref 39–117)
BUN: 4 mg/dL — ABNORMAL LOW (ref 6–23)
CO2: 27 meq/L (ref 19–32)
Calcium: 8.2 mg/dL — ABNORMAL LOW (ref 8.4–10.5)
Chloride: 99 meq/L (ref 96–112)
Creatinine, Ser: 0.43 mg/dL — ABNORMAL LOW (ref 0.50–1.10)
GFR calc Af Amer: >90 mL/min
GFR calc non Af Amer: >90 mL/min
Glucose, Bld: 102 mg/dL — ABNORMAL HIGH (ref 70–99)
Potassium: 3.4 meq/L — ABNORMAL LOW (ref 3.5–5.1)
Sodium: 133 meq/L — ABNORMAL LOW (ref 135–145)
Total Bilirubin: 0.5 mg/dL (ref 0.3–1.2)
Total Protein: 4.9 g/dL — ABNORMAL LOW (ref 6.0–8.3)

## 2012-04-27 LAB — CBC
HCT: 30.6 % — ABNORMAL LOW (ref 36.0–46.0)
Hemoglobin: 10.6 g/dL — ABNORMAL LOW (ref 12.0–15.0)
MCV: 84.3 fL (ref 78.0–100.0)
RBC: 3.63 MIL/uL — ABNORMAL LOW (ref 3.87–5.11)
RDW: 14.8 % (ref 11.5–15.5)
WBC: 13.1 10*3/uL — ABNORMAL HIGH (ref 4.0–10.5)

## 2012-04-27 MED ORDER — LABETALOL HCL 300 MG PO TABS
300.0000 mg | ORAL_TABLET | Freq: Two times a day (BID) | ORAL | Status: DC
  Administered 2012-04-27: 300 mg via NASOGASTRIC
  Filled 2012-04-27 (×3): qty 1

## 2012-04-27 MED ORDER — HYDRALAZINE HCL 10 MG PO TABS
10.0000 mg | ORAL_TABLET | Freq: Four times a day (QID) | ORAL | Status: DC
  Administered 2012-04-27 – 2012-05-02 (×19): 10 mg via ORAL
  Filled 2012-04-27 (×23): qty 1

## 2012-04-27 MED ORDER — HYDRALAZINE HCL 20 MG/ML IJ SOLN
10.0000 mg | Freq: Once | INTRAMUSCULAR | Status: AC
  Administered 2012-04-27: 10 mg via INTRAVENOUS
  Filled 2012-04-27: qty 1

## 2012-04-27 MED ORDER — FAMOTIDINE 20 MG PO TABS
20.0000 mg | ORAL_TABLET | Freq: Two times a day (BID) | ORAL | Status: DC
  Administered 2012-04-27 – 2012-05-01 (×9): 20 mg
  Filled 2012-04-27 (×11): qty 1

## 2012-04-27 MED ORDER — AMLODIPINE BESYLATE 5 MG PO TABS
5.0000 mg | ORAL_TABLET | Freq: Every day | ORAL | Status: DC
  Administered 2012-04-27 – 2012-05-01 (×5): 5 mg via NASOGASTRIC
  Filled 2012-04-27 (×6): qty 1

## 2012-04-27 MED ORDER — HYDROMORPHONE HCL PF 1 MG/ML IJ SOLN
1.0000 mg | INTRAMUSCULAR | Status: DC | PRN
  Administered 2012-04-27 – 2012-05-01 (×14): 1 mg via INTRAVENOUS
  Filled 2012-04-27 (×14): qty 1

## 2012-04-27 MED ORDER — POTASSIUM CHLORIDE 10 MEQ/100ML IV SOLN
10.0000 meq | INTRAVENOUS | Status: AC
  Administered 2012-04-27 – 2012-04-28 (×4): 10 meq via INTRAVENOUS
  Filled 2012-04-27 (×4): qty 100

## 2012-04-27 MED ORDER — PHENOL 1.4 % MT LIQD
2.0000 | OROMUCOSAL | Status: DC | PRN
  Administered 2012-04-27 – 2012-04-28 (×2): 2 via OROMUCOSAL
  Filled 2012-04-27: qty 177

## 2012-04-27 MED ORDER — FAMOTIDINE 40 MG/5ML PO SUSR: 20.0000 mg | Freq: Two times a day (BID) | ORAL | Status: DC

## 2012-04-27 MED ORDER — KCL-LACTATED RINGERS-D5W 20 MEQ/L IV SOLN
INTRAVENOUS | Status: DC
  Administered 2012-04-27: 21:00:00 via INTRAVENOUS
  Filled 2012-04-27 (×2): qty 1000

## 2012-04-27 NOTE — Progress Notes (Signed)
Norvasc given via NGT-Dr. Cherly Hensen aware

## 2012-04-27 NOTE — Progress Notes (Signed)
Pt to Xray via wheelchair.

## 2012-04-27 NOTE — Progress Notes (Signed)
Events and notes reviewed. Pt in better spirit. No flatus. On Ancef due to bladder injury and indwelling foley  C/o dry mouth. Getting ice chips  O: BP 152/83( after hydralazine) T 98.9 P 71  ABD: less distended soft. Dressing intact (+) BS. Fundus unable to assess due to ileus  NJ tube in place. Green fluid(700 cc total since placement) Pad scant lochia Foley: currently draining clear urine  KUB c/w ileus  K=3.4( after 3 runs of potassium)  A/P chronic HTN . meds changed due to NG tube ( norvasc), labetalol d/c'ed due to bradycardia. IV hydralazine given x 1 Anemia  S/p 6 units PRBC, FFP Bladder cystotomy on foley drainage ILeus on NG to intermittent suction Postop Rpt C/S Hypokalemia improved but anticipate lower level w/ GI suction P) d/c labetalol. BMET in am. Add hydralazine. Cont NGT. Add potassium to mainline fluid and repeat another run of KCL now

## 2012-04-27 NOTE — Progress Notes (Signed)
10mg Hydralazine given IV

## 2012-04-27 NOTE — Progress Notes (Signed)
S:   POD#2 C/S, myomectomy/accidental cystotomy/HTN             Ileus with NG tube in place, 500 cc drained overnight             Feels a little better x NG tube placement/no gas/pain worse when laying on her back/NPO/Ambulating minimally  Newborn BFeeding  O: A&O x 3/ Filed Vitals:   04/27/12 0834  BP: 165/90  Pulse: 58  Temp: 98.3 F (36.8 C)  Resp: 16     Labs: Lab Results  Component Value Date   WBC 13.1* 04/27/2012   HGB 10.6* 04/27/2012   HCT 30.6* 04/27/2012   MCV 84.3 04/27/2012   PLT 96* 04/27/2012      Intake/Output Summary (Last 24 hours) at 04/27/12 0902 Last data filed at 04/27/12 0830  Gross per 24 hour  Intake 3107.59 ml  Output   6025 ml  Net -2917.41 ml     Lab Results  Component Value Date   WBC 13.1* 04/27/2012   HGB 10.6* 04/27/2012   HCT 30.6* 04/27/2012   MCV 84.3 04/27/2012   PLT 96* 04/27/2012    Lungs: clear  Heart: rcr  Abdomen: softer upper abdo, but still distended/Bowel sounds: some high pitch sounds /Insicion: intact  Lochia: normal  Extremities: normal, no oedema           no calf pain/tenderness              Chest/abdo X-ray:  Mild atelectasis, minimal pneumoperitoneum c/w post C/S, mild ileus.                         A/P: POD #2 C/S/TL/myomectomy/cystotomy [id number]             Mild Ileus with NG tube in place, no evidence of bowel obstruction or trauma             HTN will give Labetalol thru NG tube and switch from Procardia to Norvasc thru NG tube  Continue other routine postop orders  Pending am labs

## 2012-04-27 NOTE — Progress Notes (Signed)
Dr. Seymour Bars made aware of BP-will recheck and medicate if necessary.

## 2012-04-27 NOTE — Progress Notes (Signed)
Clinical Social Work Department PSYCHOSOCIAL ASSESSMENT - MATERNAL/CHILD 04/27/2012  Patient:  [name],[name]  Account Number:  [id number]  Admit Date:  04/25/2012  Childs Name:   Michael Fraizer    Clinical Social Worker:  Eymi Lipuma, LCSW   Date/Time:  04/27/2012 03:30 PM  Date Referred:  04/25/2012   Referral source  Physician     Referred reason  NICU   Other referral source:    I:  FAMILY / HOME ENVIRONMENT Child's legal guardian:  PARENT  Guardian - Name Guardian - Age Guardian - Address  [name] 45 [address] [place], [place] [address]  [name]  [address] [place], [place] [address]   Other household support members/support persons Name Relationship DOB  Carter Armwood BROTHER 5 years old   Other support:   MGM and Maternal Aunt in room.    II  PSYCHOSOCIAL DATA Information Source:  Patient Interview  Financial and Community Resources Employment:   teacher   Financial resources:  Private Insurance If Medicaid - County:    School / Grade:   Maternity Care Coordinator / Child Services Coordination / Early Interventions:  Cultural issues impacting care:    III  STRENGTHS Strengths  Adequate Resources  Supportive family/friends  Home prepared for Child (including basic supplies)  Compliance with medical plan  Understanding of illness   Strength comment:    IV  RISK FACTORS AND CURRENT PROBLEMS Current Problem:  None   Risk Factor & Current Problem Patient Issue Family Issue Risk Factor / Current Problem Comment   N N     V  SOCIAL WORK ASSESSMENT CSW spoke with MOB and FOB with other family in room.  MOB currently lives with her spouse and son here in [place]. MOB is currently in ICU due to complications with delivery and continues with NG tube.  MOB was did not want to speak too much with NG tube so she has FOB speak with CSW mostly. Discussed admission to NICU and MOB and FOB's understanding of treatment.  Both  expressed understanding and felt all information was being communicated well to them.  FOB reports visting infant as often as possible.  CSW explored MOB emotional sate, as MOB seemed to have a flat affect, however MOB did not voice any concerns at this time. Discussed any concerns with supplies for the infant.  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  Discussed SSI and that weekday CSW would follow-up  with them on applying for SSI.  CSW noted lots of family support and FOB also stated they had many church family as well.  No hx of drug abuse and no current concerns.  CSW will continue to follow while infant in the NICU.      VI SOCIAL WORK PLAN Social Work Plan  Psychosocial Support/Ongoing Assessment of Needs   Type of pt/family education:   If child protective services report - county:   If child protective services report - date:   Information/referral to community resources comment:   Other social work plan:    

## 2012-04-27 NOTE — Addendum Note (Signed)
Addendum  created 04/27/12 1059 by Earmon Phoenix, CRNA   Modules edited:Anesthesia LDA

## 2012-04-28 ENCOUNTER — Encounter (HOSPITAL_COMMUNITY): Payer: Self-pay | Admitting: Obstetrics and Gynecology

## 2012-04-28 LAB — BASIC METABOLIC PANEL
BUN: 4 mg/dL — ABNORMAL LOW (ref 6–23)
CO2: 26 mEq/L (ref 19–32)
Calcium: 8.3 mg/dL — ABNORMAL LOW (ref 8.4–10.5)
Chloride: 98 mEq/L (ref 96–112)
Creatinine, Ser: 0.39 mg/dL — ABNORMAL LOW (ref 0.50–1.10)
GFR calc Af Amer: >90 mL/min (ref >90)
GFR calc non Af Amer: >90 mL/min (ref >90)
Glucose, Bld: 99 mg/dL (ref 70–99)
Potassium: 3.3 mEq/L — ABNORMAL LOW (ref 3.5–5.1)
Sodium: 133 mEq/L — ABNORMAL LOW (ref 135–145)

## 2012-04-28 MED ORDER — MAGIC MOUTHWASH
5.0000 mL | Freq: Three times a day (TID) | ORAL | Status: DC | PRN
  Administered 2012-04-28 – 2012-04-29 (×4): 5 mL via ORAL
  Filled 2012-04-28 (×2): qty 5

## 2012-04-28 MED ORDER — POTASSIUM CHLORIDE 10 MEQ/100ML IV SOLN
10.0000 meq | INTRAVENOUS | Status: AC
  Administered 2012-04-28: 10 meq via INTRAVENOUS
  Filled 2012-04-28: qty 100

## 2012-04-28 MED ORDER — POTASSIUM CHLORIDE 2 MEQ/ML IV SOLN
INTRAVENOUS | Status: DC
  Administered 2012-04-28 – 2012-05-01 (×5): via INTRAVENOUS
  Filled 2012-04-28 (×9): qty 1000

## 2012-04-28 MED ORDER — POTASSIUM CHLORIDE 10 MEQ/100ML IV SOLN
10.0000 meq | INTRAVENOUS | Status: AC
  Administered 2012-04-28 (×2): 10 meq via INTRAVENOUS
  Filled 2012-04-28 (×4): qty 100

## 2012-04-28 NOTE — Anesthesia Postprocedure Evaluation (Signed)
  Anesthesia Post-op Note  Patient: [name]  Procedure(s) Performed: Procedure(s) (LRB): CESAREAN SECTION WITH BILATERAL TUBAL LIGATION (Bilateral) MYOMECTOMY (N/A) CYSTOSCOPY (N/A)  Patient Location: Mother/Baby  Anesthesia Type: Epidural  Level of Consciousness: awake  Airway and Oxygen Therapy: Patient Spontanous Breathing  Post-op Pain: mild  Post-op Assessment: Patient's Cardiovascular Status Stable and Respiratory Function Stable  Post-op Vital Signs: stable  Complications: No apparent anesthesia complications

## 2012-04-28 NOTE — Progress Notes (Addendum)
Patient ID: [name], female   DOB: [birth year], 46 y.o.   MRN: [id number] POD # 3  Subjective: Pt reports feeling "a little better".  Improved pain control.  Notes abd is less swollen and less firm. NG remains in place with small amt output, clear/brownish Remains NPO, except ice chips Foley patent, draining clear urine Activity: up with assistance Bleeding is light Newborn info:  Information for the patient's newborn:  Nichole, Young [[id number]]  female  Infant in NICU, stable/ Feeding: bottle   Objective: VS: Blood pressure 153/84, pulse 66, temperature 98.8 F (37.1 C), temperature source Oral, resp. rate 18, height 5\' 1"  (1.549 m), weight 65.772 kg (145 lb), last menstrual period 07/26/2011, SpO2 100.00%.   LABS:  Basename 04/27/12 0530 04/26/12 1804  WBC 13.1* 11.2*  HGB 10.6* 10.2*  HCT 30.6* 28.8*  PLT 96* 77*     Physical Exam:  General: alert, cooperative and no distress CV: Regular rate and rhythm Resp: clear Abdomen: Firm, faint/hypoactive BS in upper quads/No BS noted in lower quads Incision: well approximated Uterine Fundus: unable to assess d/t ileus Ext: edema trace to +1 and Homans sign is negative, no sign of DVT    A/P: POD # 3/ [id number]/ S/P C/Section d/t IUGR/pos DS on harmony screen Chronic HTN; meds changed yesterday to norvasc and apresoline Ileus persists, but improving Hypokalemia; K 3.3 today Will review plan with MD    Signed: Demetrius Revel, MSN, [facility] 04/28/2012, 10:29 AM  8/5/'2013  OB Primary  Pt seen and examined.  Throat better w/ swish and swallow.  Abd: markedly less distended. Hypoactive BS Prim dressing clean dry and intact  Extrem: sl edema   IMP: ileus w/ clinical better exam Chronic HTn on new meds getting better Bladder cystotomy  Foley in place for 10 day. Recheck integrity w/ retrograde filling prior to removal( outpt) Hypokalemia on going and being repleted Anemia.. Stable Thrombocytopenia P) transfer  to pp floor cont with current regimen

## 2012-04-28 NOTE — Addendum Note (Signed)
Addendum  created 04/28/12 0817 by Renford Dills, CRNA   Modules edited:Notes Section

## 2012-04-28 NOTE — Progress Notes (Signed)
UR chart review completed.  

## 2012-04-29 ENCOUNTER — Encounter (HOSPITAL_COMMUNITY): Payer: Self-pay

## 2012-04-29 DIAGNOSIS — D696 Thrombocytopenia, unspecified: Secondary | ICD-10-CM

## 2012-04-29 DIAGNOSIS — K9189 Other postprocedural complications and disorders of digestive system: Secondary | ICD-10-CM | POA: Diagnosis not present

## 2012-04-29 DIAGNOSIS — K567 Ileus, unspecified: Secondary | ICD-10-CM

## 2012-04-29 HISTORY — DX: Thrombocytopenia, unspecified: D69.6

## 2012-04-29 HISTORY — DX: Ileus, unspecified: K56.7

## 2012-04-29 LAB — PREPARE FRESH FROZEN PLASMA: Unit division: 0

## 2012-04-29 LAB — TYPE AND SCREEN
Antibody Screen: NEGATIVE
Unit division: 0
Unit division: 0
Unit division: 0

## 2012-04-29 MED ORDER — SUCRALFATE 1 GM/10ML PO SUSP
1.0000 g | Freq: Three times a day (TID) | ORAL | Status: DC
  Administered 2012-04-29 – 2012-05-01 (×10): 1 g via ORAL
  Filled 2012-04-29 (×18): qty 10

## 2012-04-29 NOTE — Progress Notes (Signed)
04/29/12 1300  Clinical Encounter Type  Visited With Patient and family together (Mom and Nichole [name])  Visit Type Initial;Spiritual support;Social support  Recommendations Spiritual Care will continue to follow for spiritual/emo support.  Spiritual Encounters  Spiritual Needs Emotional;Grief support (Pt is grieving hopes/plans as she processes DS.)  Stress Factors  Patient Stress Factors Loss of control;Exhausted;Financial concerns (Overwhelmed by pain, emotions/hormones, distance from Nichole.)    Made tender, lengthy visit with Nichole [name] and Nichole [name] and [name].  Nichole [name] went through a variety of emotions, processing feelings of anger and guilt about Nichole [name]'s DS diagnosis, dealing with postpartum hormones, and struggling with physical pain and complications (especially abdominal and throat pain from tube).  She shared about Nichole complex and painful biopsychosocial history, past perinatal losses, faith and ministry, and growth over time.   Provided compassionate listening, pastoral reflection, quiet prayer at bedside, encouragement and support to family. Nichole [name] asked me to come back, so we plan another visit this afternoon, and Spiritual Care will follow for support.  [address] [place], [place] [phone]

## 2012-04-29 NOTE — Progress Notes (Signed)
SW met with MOB, MGM and MGM's sister in MOB's third floor room to check in with MOB since initially meeting her in Antenatal and to discuss baby's eligibility for SSI.  Weekend SW completed assessment after baby's birth, but did not discuss SSI.  Dr. Davanzo/Neo was leaving the room when SW entered.  MOB states that she feels awful.  She looks very down and on the verge of tears.  She states that she would like the baby to be brought to her in her room because she has been so sick and unable to go see him in the NICU.  SW stated understanding, but is not sure this is a possibility.  SW asked MOB what is going on with her physically and she asked her aunt to update SW because her throat hurts.  She explained the complications MOB has had and said that she is concerned that MOB is becoming very depressed.  She paused and added, "especially now."  SW asked her what she meant by this and they explained that they were just informed that the results of baby's genetic testing came back positive for Down Syndrome.  SW asked MOB if she knew this during her pregnancy because this was not something she mentioned when SW first met her.  She states that all the doctors were telling her that the baby may have Down Syndrome and that the Harmony test showed a 99% chance, but that she "chose not to listen."  She states that she has been praying and will continue to pray that baby does not have Down Syndrome.  SW states that SW is here to listen any time MOB feels like talking and that SW would like to discuss opportunities for support if/when she is ready.  She states she was mad when doctors told her she could abort the baby or give him up for adoption since he was going to have Down Syndrome.  SW validated that this would be upsetting.  SW sees that MOB clearly loves her child and that his diagnosis will not affect this, but thinks that MOB is somewhat in denial at this time and needs time to process.  Her family seems to be  accepting the diagnosis more readily.  They seemed appreciative to talk with SW.  SW asked MOB to please call SW any time she would like to talk.  SW informed MOB of baby's eligibility for SSI due to his gestational age and weight.  She is interested in applying and SW assisted her in completing the application.  SW asked MOB to keep a check on her emotional wellbeing and gave her "Feelings After Birth" handout.  She admits to symptoms of depression as a teenager, but nothing since.  SW thinks she may benefit from an antidepressant to get her through the post partum period and encouraged her to discuss this option with her doctor if she agrees.  SW will continue to monitor and offer support and assistance as needed.   

## 2012-04-29 NOTE — Progress Notes (Signed)
04/29/12 1500  Clinical Encounter Type  Visited With Patient not available;Family  Visit Type Follow-up    [name] was sleeping when I came for follow-up visit.  Plan to visit again tomorrow morning.   [address] [place], [place] [phone]

## 2012-04-29 NOTE — Progress Notes (Signed)
Subjective: Patient reports no flatus. C/o sore throat and epigastric discomfort.  Objective: I have reviewed patient's vital signs.  vital signs, intake and output, medications and labs. Filed Vitals:   04/29/12 0552  BP: 174/92  Pulse:   Temp:   Resp:    I/O last 3 completed shifts: In: 1820 [I.V.:1275; NG/GT:195; IV Piggyback:350] Out: 1610 [RUEAV:4098; Emesis/NG output:650]    Lab Results  Component Value Date   WBC 13.1* 04/27/2012   HGB 10.6* 04/27/2012   HCT 30.6* 04/27/2012   MCV 84.3 04/27/2012   PLT 96* 04/27/2012   Lab Results  Component Value Date   CREATININE 0.39* 04/28/2012    EXAM General: alert, cooperative and no distress Resp: clear to auscultation bilaterally Cardio: regular rate and rhythm, S1, S2 normal, no murmur, click, rub or gallop GI: soft active BS throughout, uterus 1 FB below umb, decreased distention. primary dressing removed. incision w/ staples, well approx. no erythema or induration Extremities: trace edema, no calf tenderness Vaginal Bleeding: minimal  Assessment: s/p Procedure(s):Repeat CESAREAN SECTION WITH BILATERAL TUBAL LIGATION MYOMECTOMY, bladder cystotomy CYSTOSCOPY: Postop complication: Ileus improving Bowel function Chronic HTN on Norvasc/hydralazine.Marland Kitchenlabile Hypokalemia exacerbated by NG suctioning  Plan:  D/c NGT w/ passage of flatus. Keep foley for 10 day. sched retrograde assessment of bladder prior to removal. Advance diet with removal of NGT. Will need cipro bid for foley when home. Continue foley due to bladder injury. Iron supplement with regular diet  LOS: 4 days    [name] A, MD 04/29/2012 9:15 AM    04/29/2012, 9:15 AM

## 2012-04-30 MED ORDER — BENZOCAINE (TOPICAL) 20 % EX AERO
INHALATION_SPRAY | Freq: Four times a day (QID) | CUTANEOUS | Status: DC | PRN
  Filled 2012-04-30: qty 57

## 2012-04-30 NOTE — Progress Notes (Signed)
Patient ID: [name], female   DOB: 1965/12/04, 46 y.o.   MRN: [id number] POD # 5  Subjective: Pt reports feeling sad, hungry, tearful/ Pain controlled with dilaudid NG in place and pt c/o constant discomfort Tolerating ice chips/No n/v/Flatus once last pm Foley remains in place and patent with clear yellow urine out Activity: up with assistance Bleeding is light Newborn info:  Information for the patient's newborn:  Jhana, Giarratano [[id number]]  female  Infant in NICU  Objective: VS: Blood pressure 150/85, pulse 82, temperature 98.3 F (36.8 C), temperature source Oral, resp. rate 19  NG output 80cc in past 8 hours    Physical Exam:  General: alert, cooperative and tearful CV: Regular rate and rhythm Resp: clear Abdomen: upper quads soft, but some firmness persists in lower quads FAINT bowel sounds noted in upper quads; no BS noted in lower quads Incision is well approximated with staples; C/D/I; no edema Uterine Fundus: firm, below umbilicus, nontender Lochia: minimal Ext: edema +1 - +2 and Homans sign is negative, no sign of DVT    A/P: POD # 2/ G6P2133/ S/P C/Section d/t elective repeat with cystotomy Post op Ileus; NG remains in place, hurricane spray applied Chronic HTN Thrombocytopenia Will obtain BP's Q 4 hrs Will consult with MD regarding plan of care.     Signed: Demetrius Revel, MSN, [facility] 04/30/2012, 10:30 AM

## 2012-04-30 NOTE — Progress Notes (Signed)
04/30/12 1200  Clinical Encounter Type  Visited With Patient and family together (husband [name])  Visit Type Follow-up;Spiritual support;Social support  Recommendations Spiritual Care will follow.  Spiritual Encounters  Spiritual Needs Emotional;Prayer    Followed up with [name] and husband [name] to provide pastoral support and encouragement.  [name] continues to feel significant pain, discomfort, and stress, but is working to maintain a spirit of gratitude.  She values prayer and is pleased to know of ongoing chaplain availability.  Spiritual Care will continue to follow for spiritual and emotional support.  [address] [place], [place] [phone]

## 2012-05-01 ENCOUNTER — Ambulatory Visit (HOSPITAL_COMMUNITY): Payer: BC Managed Care – PPO

## 2012-05-01 MED ORDER — FAMOTIDINE 20 MG PO TABS
20.0000 mg | ORAL_TABLET | Freq: Two times a day (BID) | ORAL | Status: DC
Start: 2012-05-01 — End: 2012-05-02
  Administered 2012-05-01 – 2012-05-02 (×2): 20 mg via ORAL
  Filled 2012-05-01 (×2): qty 1

## 2012-05-01 MED ORDER — HYDRALAZINE HCL 20 MG/ML IJ SOLN
10.0000 mg | Freq: Once | INTRAMUSCULAR | Status: AC
  Administered 2012-05-01: 10 mg via INTRAVENOUS
  Filled 2012-05-01: qty 1

## 2012-05-01 NOTE — Progress Notes (Signed)
6 Days Post-Op Procedure(s) (LRB): CESAREAN SECTION WITH BILATERAL TUBAL LIGATION (Bilateral) MYOMECTOMY (N/A) CYSTOSCOPY (N/A)  For accidental cystotomy.  Subjective: Patient reports that pain is well managed with Dilaudid IV.  Tolerating NPO with NG tube. No nausea/vomiting.  Occasional gas.  No BM. Ambulating and voiding.  Objective: BP 172/83  Pulse 87  Temp 98.7 F (37.1 C) (Oral)  Resp 18  Ht 5\' 1"  (1.549 m)  Wt 65.772 kg (145 lb)  BMI 27.40 kg/m2  SpO2 98%  LMP 07/26/2011 Lungs: clear Heart: normal rate and rhythm Abdomen:soft and appropriately tender Extremities: Homans sign is negative, no sign of DVT Incision: healing well  NG tube drained 120 cc in last 12 hrs.  Assessment: s/p Procedure(s):  Ileus slowly resolving.  cHTN difficult to control CESAREAN SECTION WITH BILATERAL TUBAL LIGATION MYOMECTOMY CYSTOSCOPY: stable  Plan: D/C NG tube.  Liquid diet today.  Will see if BP better controled with PO anti-HTN meds rather than crushed in NG tube.  LOS: 6 days    [name],[name] 05/01/2012, 11:03 AM

## 2012-05-01 NOTE — Telephone Encounter (Signed)
See phone note from 03/24/2012.

## 2012-05-01 NOTE — Progress Notes (Signed)
This was a follow-up visit with [name], who was seen by Caro Hight earlier in the week.  [name] was in a great deal of pain and feeling that she had no control over her body when I first entered.  During our visit, her doctor came to remove the tubing in her nose.  When I went back in to the room, she was crying tears of joy.  [name] is very important to [name] and she is a prayerful person.  She has been through much in her life, but she feels renewed energy now that things are beginning to change with her own condition.  She hopes to be able to see her baby again soon.    I will follow-up with her tomorrow.  Please page as needed, [phone].  Chaplain Dyanne Carrel 12:31 PM   05/01/12 1200  Clinical Encounter Type  Visited With Patient and family together  Visit Type Follow-up;Spiritual support  Referral From Chaplain  Spiritual Encounters  Spiritual Needs Prayer;Emotional  Stress Factors  Patient Stress Factors (Pain, Unable to hold and see baby)

## 2012-05-02 ENCOUNTER — Encounter (HOSPITAL_COMMUNITY)
Admission: RE | Admit: 2012-05-02 | Discharge: 2012-05-02 | Disposition: A | Discharge status: Home / Self Care | Payer: BC Managed Care – PPO | Source: Ambulatory Visit | Attending: Obstetrics and Gynecology | Admitting: Obstetrics and Gynecology

## 2012-05-02 DIAGNOSIS — O923 Agalactia: Secondary | ICD-10-CM | POA: Insufficient documentation

## 2012-05-02 LAB — COMPREHENSIVE METABOLIC PANEL
ALT: 22 U/L (ref 0–35)
AST: 27 U/L (ref 0–37)
Albumin: 2.8 g/dL — ABNORMAL LOW (ref 3.5–5.2)
CO2: 23 mEq/L (ref 19–32)
Calcium: 8.4 mg/dL (ref 8.4–10.5)
GFR calc non Af Amer: >90 mL/min (ref >90)
Sodium: 136 mEq/L (ref 135–145)

## 2012-05-02 LAB — CBC
MCH: 29.1 pg (ref 26.0–34.0)
Platelets: 291 10*3/uL (ref 150–400)
RBC: 3.88 MIL/uL (ref 3.87–5.11)
RDW: 14.6 % (ref 11.5–15.5)
WBC: 8.2 10*3/uL (ref 4.0–10.5)

## 2012-05-02 MED ORDER — OXYCODONE-ACETAMINOPHEN 5-325 MG PO TABS: 1.0000 | ORAL_TABLET | ORAL | Status: AC | PRN

## 2012-05-02 MED ORDER — AMLODIPINE BESYLATE 10 MG PO TABS: 10.0000 mg | ORAL_TABLET | Freq: Every day | ORAL | Status: DC

## 2012-05-02 MED ORDER — HYDRALAZINE HCL 10 MG PO TABS: 10.0000 mg | ORAL_TABLET | Freq: Four times a day (QID) | ORAL | Status: DC

## 2012-05-02 MED ORDER — AMLODIPINE BESYLATE 10 MG PO TABS
10.0000 mg | ORAL_TABLET | Freq: Every day | ORAL | Status: DC
  Filled 2012-05-02: qty 1

## 2012-05-02 MED ORDER — FERROUS SULFATE 325 (65 FE) MG PO TABS: 325.0000 mg | ORAL_TABLET | Freq: Two times a day (BID) | ORAL | Status: DC

## 2012-05-02 MED ORDER — AMLODIPINE BESYLATE 10 MG PO TABS
10.0000 mg | ORAL_TABLET | Freq: Once | ORAL | Status: AC
  Administered 2012-05-02: 10 mg via ORAL
  Filled 2012-05-02: qty 1

## 2012-05-02 NOTE — Progress Notes (Signed)
Pt tolerated a regular diet today.  No complaints of pain until during d/c intructions at 1730!  Pt verbalized she understood she could take pain med only if she really needed it. And choose not to take it,    Reviewed D/C instructions c pt. Pt verbalizes understanding, then asked for a wheelchair to go see her baby on the way out.  Used the 'Teach back" method to have her tell me why she should NOT be wheeled to the NICU!  She verbalized the importance of walking to me, agreed she should walk.  Pt. was tearful at times today, anxious about going home.  She verbalized she felt overwhelmed.  I assured her this was completely understandable, and reviewed c her at D/c that she had support from her MD's, lactation, and her family.  and not to hesitate to call Dr. Cherly Hensen office if she felt depressed, overwhelmed, or anxious.    Utilized 'teach back' to review 'foley care'.  Pt verbalized understanding of emptying bag, checking for kinks, using the big bag at night and keeping it below her bladder. She will call if she has any concerns.    Despite repeated encouragement, pt did not ambulate in the hall today.  She did not go to NICU until after discharge, kept saying she wanted to pump first.  At 2pm p taking the staples out and applying sterris, I set the breast pump up and had her do it for 20 minutes.    M.T. Rubye Oaks RN

## 2012-05-02 NOTE — Progress Notes (Signed)
SW met with MOB and her daughter at baby's bedside.  MOB was in a completely different emotional state than she was yesterday when SW met with her.  She has her feeding tube out and states that other things are resolving as well.  She is in great spirits and snuggling skin to skin with baby.  She thanked SW for visiting and talking with her.  She reports no questions or concerns at this time. 

## 2012-05-02 NOTE — Progress Notes (Signed)
Patient ID: [name], female   DOB: August 21, 1966, 46 y.o.   MRN: [id number] POD # 7  Subjective: Pt reports feeling markedly better.  Much happier with progress Pain controlled with ibuprofen and percocet NG D/C'ed yesterday am. Tolerating po/Voiding without problems/ No n/v/Flatus POSITIVE and has had BM Activity: out of bed and ambulate Bleeding is spotting Newborn info:  Information for the patient's newborn:  [name], [place] [[id number]]  female Feeding: breast   Objective: VS: Blood pressure 149/94, pulse 70, temperature 98.6 F (37 C), temperature source Oral, resp. rate 16  BP range past 12 hrs:  149 - 157/ 79-94   K: 3.8, today   LABS:  Basename 05/02/12 0910  WBC 8.2  HGB 11.3*  HCT 33.1*  PLT 291     Physical Exam:  General: alert, cooperative and no distress CV: Regular rate and rhythm Resp: clear Abdomen: soft, nontender, normal bowel sounds Incision: healing well, well approximated Uterine Fundus: firm, below umbilicus, nontender Lochia: minimal Ext: Homans sign is negative, no sign of DVT and no edema, redness or tenderness in the calves or thighs    A/P: POD # 3/ G6P2133/PTD @ 34w 3d S/P Repeat C/Section w/ BTL, myomectomy, and cystoscopy Hx Hypertension; will d/c with Norvasc and hydralazine/Thrombocytopenia Hx Ileus this admission; resolved and tolerating reg diet this am S/P Cystoscopy:  Foley remains; will instruct on leg bag and home care. F/U with Dr Cherly Hensen 05/08/12 @ 8:15 for d/c foley and retrograde urine assmt Remove staples and apply benzoin and steri strips Doing well and stable for discharge home Pt aware to f/u with PCP for [facility] mgmt. RX's: Resume iron supplement Percocet 5/325 1 - 2 tabs po every 6 hrs prn pain  #30 No refill Norvasc 10mg  po QD #30 ref x 1 Hydralazine 10 QID #112 ref x 1   Signed: Demetrius Revel, MSN, [facility] 05/02/2012,

## 2012-05-02 NOTE — Discharge Summary (Signed)
Obstetric Discharge Summary Reason for Admission: 34w 3d with worsening HTN vs PEC, IUGR and known fetal anomalyHx previous c/s, fibroids Prenatal Procedures: NST/BPP and  Serial ultrasound( MFM) Intrapartum Procedures: Repeat C-section/Classical incision, myomectomy, cytotomy repair, bilateral tubal ligation, cystoscopy Postpartum Procedures: AICU admission, NG tube insertion, administration of PRBC's and FFP,  and Potassium replacement by IV Complications-Operative and Postpartum: Cystotomy, Ileus, Hypokalemia, Anemia, Thrombocytopenia Hemoglobin  Date Value Range Status  05/02/2012 11.3* 12.0 - 15.0 g/dL Final     HCT  Date Value Range Status  05/02/2012 33.1* 36.0 - 46.0 % Final   [name]: 3.8 at discharge Plt: 291 Hospital course.  Pt was admitted for delivery based on MFM recommendation due to lack of fetal growth. PNC complicated by increased DS risk on ultracreen and (+) testing w/ fetal DNA( harmony test). Pt declined amniocentesis. Pt also had intermittent episode of painful ctx exacerbated by large post fibroid. Pt was admitted x 1 previous due to elev BP. Pt has known underlying chronic HTN. Fetal anatomy showed ventriculomegaly which had been followed and resolved. Polyhydramnios and fetal growth followed by MFM. Pt underwent repeat C/S. She desired permanent sterilization. Pt had rpt C/S, myomectomy, incidental cystotomy and cystoscopy post repair. Please see operative report. Path showed 434 gram fibroid. Intraoperatively pt received blood transfusion due to acute blood loss.She was admitted to AICU due to acute blood loss and HTN. Pt was followed with CBC. She was given additional blood in the AICU for symptomatic anemia after receiving 4 units PRBC, 4 U FFP. DIC panel was done. Pt also developed postop ileus which required and responded to bowel rest, NG tube. Elevated BP necessitated use of hydralazine as well as changing BP meds due to NG tube use. Pt has hypokalemia thought 2nd to NG tube  removal of fluids. Potassium was repleted. She was also tested for additional causes of ileus( magnesium, phosphate levels). Pt remained w/ NG tube until passage of flatus. She had KUB and upright which showed ileus only. Baby remained in NICU due to prematurity and Down syndrome was confirmed. Once ileus resolved, NG tube removed and diet advanced.  Physical Exam:  General: alert, cooperative and no distress Lochia: appropriate Uterine Fundus: firm Incision: healing well, and staples to be removed today DVT Evaluation: No evidence of DVT seen on physical exam.  Discharge Diagnoses: Preterm delivery @  wks; S/P cystoscopy, Anemia, Thrombocytopenia (resolved) HTN and ABL Anemia  Discharge Information: Date: 05/02/2012 Activity: pelvic rest Diet: Advance slowly; bland foods Medications: Norvasc, Hydralyzine, Percocet, Fe supplement Condition: stable and Indwelling foley catheter remains Instructions: refer to practice specific booklet and return 05/08/12 for BP check and removal of Foley with retrograde urine assmt Discharge to: home Follow-up Information    Follow up with Fotios Amos A, MD in 6 days. (Thursday, 05/08/12 @ 8:15)    Contact information:   [address] [place] [address] [phone]          Newborn Data: Live born female on 04/25/12 Birth Weight: 3 lb 4.4 oz (1486 g) APGAR: 5, 6  Infant remains stable in NICU  [name],[name] 05/02/2012, 12:53 PM

## 2012-05-02 NOTE — Progress Notes (Signed)
This was a follow-up visit with [name].  She was in good spirits this morning and was grateful to be off of the IV today.  She is feeling much better and looking forward to going home, even though she is sad that her baby will remain here.  She had the opportunity to go down to visit baby [name] and she is grateful that he continues to make good progress.  I met her husband today as well.  He did not say much, but I gather that they are supporting each other very well through this time.  Please page as needed, [phone].  Chaplain Orpha Bur Soham Hollett 10:22 AM   05/02/12 1000  Clinical Encounter Type  Visited With Patient and family together  Visit Type Follow-up  Spiritual Encounters  Spiritual Needs Emotional

## 2012-05-05 ENCOUNTER — Ambulatory Visit (HOSPITAL_COMMUNITY)
Admission: RE | Admit: 2012-05-05 | Discharge: 2012-05-05 | Disposition: A | Discharge status: Home / Self Care | Payer: BC Managed Care – PPO | Source: Ambulatory Visit | Attending: Obstetrics and Gynecology | Admitting: Obstetrics and Gynecology

## 2012-05-05 ENCOUNTER — Other Ambulatory Visit (HOSPITAL_COMMUNITY): Payer: Self-pay | Admitting: Obstetrics and Gynecology

## 2012-05-05 DIAGNOSIS — IMO0002 Reserved for concepts with insufficient information to code with codable children: Secondary | ICD-10-CM | POA: Insufficient documentation

## 2012-05-05 MED ORDER — DIATRIZOATE MEGLUMINE 30 % UR SOLN
Freq: Once | URETHRAL | Status: AC | PRN
  Administered 2012-05-05: 200 mL

## 2014-07-26 ENCOUNTER — Encounter (HOSPITAL_COMMUNITY): Payer: Self-pay

## 2014-09-02 ENCOUNTER — Emergency Department (HOSPITAL_COMMUNITY)
Admission: EM | Admit: 2014-09-02 | Discharge: 2014-09-03 | Disposition: A | Discharge status: Home / Self Care | Payer: BC Managed Care – PPO | Attending: Emergency Medicine | Admitting: Emergency Medicine

## 2014-09-02 ENCOUNTER — Encounter (HOSPITAL_COMMUNITY): Payer: Self-pay | Admitting: Emergency Medicine

## 2014-09-02 ENCOUNTER — Emergency Department (HOSPITAL_COMMUNITY): Payer: BC Managed Care – PPO

## 2014-09-02 DIAGNOSIS — K429 Umbilical hernia without obstruction or gangrene: Secondary | ICD-10-CM

## 2014-09-02 DIAGNOSIS — R109 Unspecified abdominal pain: Secondary | ICD-10-CM | POA: Diagnosis present

## 2014-09-02 DIAGNOSIS — R1011 Right upper quadrant pain: Secondary | ICD-10-CM

## 2014-09-02 DIAGNOSIS — I10 Essential (primary) hypertension: Secondary | ICD-10-CM | POA: Diagnosis not present

## 2014-09-02 DIAGNOSIS — Z79899 Other long term (current) drug therapy: Secondary | ICD-10-CM | POA: Insufficient documentation

## 2014-09-02 DIAGNOSIS — D696 Thrombocytopenia, unspecified: Secondary | ICD-10-CM | POA: Insufficient documentation

## 2014-09-02 DIAGNOSIS — Z8639 Personal history of other endocrine, nutritional and metabolic disease: Secondary | ICD-10-CM | POA: Diagnosis not present

## 2014-09-02 DIAGNOSIS — Z87448 Personal history of other diseases of urinary system: Secondary | ICD-10-CM | POA: Diagnosis not present

## 2014-09-02 DIAGNOSIS — Z8742 Personal history of other diseases of the female genital tract: Secondary | ICD-10-CM | POA: Diagnosis not present

## 2014-09-02 LAB — COMPREHENSIVE METABOLIC PANEL
ALT: 11 U/L (ref 0–35)
ANION GAP: 15 (ref 5–15)
AST: 17 U/L (ref 0–37)
Albumin: 3.9 g/dL (ref 3.5–5.2)
Alkaline Phosphatase: 59 U/L (ref 39–117)
BILIRUBIN TOTAL: 0.2 mg/dL — AB (ref 0.3–1.2)
BUN: 14 mg/dL (ref 6–23)
CHLORIDE: 102 meq/L (ref 96–112)
CO2: 21 meq/L (ref 19–32)
CREATININE: 0.64 mg/dL (ref 0.50–1.10)
Calcium: 9.1 mg/dL (ref 8.4–10.5)
Glucose, Bld: 92 mg/dL (ref 70–99)
Potassium: 3.9 mEq/L (ref 3.7–5.3)
Sodium: 138 mEq/L (ref 137–147)
Total Protein: 7.1 g/dL (ref 6.0–8.3)

## 2014-09-02 LAB — CBC WITH DIFFERENTIAL/PLATELET
BASOS PCT: 0 % (ref 0–1)
Basophils Absolute: 0 10*3/uL (ref 0.0–0.1)
Eosinophils Absolute: 0 10*3/uL (ref 0.0–0.7)
Eosinophils Relative: 0 % (ref 0–5)
HEMATOCRIT: 37.2 % (ref 36.0–46.0)
HEMOGLOBIN: 12.3 g/dL (ref 12.0–15.0)
Lymphocytes Relative: 29 % (ref 12–46)
Lymphs Abs: 1.6 10*3/uL (ref 0.7–4.0)
MCH: 26.9 pg (ref 26.0–34.0)
MCHC: 33.1 g/dL (ref 30.0–36.0)
MCV: 81.4 fL (ref 78.0–100.0)
MONO ABS: 0.6 10*3/uL (ref 0.1–1.0)
MONOS PCT: 10 % (ref 3–12)
NEUTROS ABS: 3.3 10*3/uL (ref 1.7–7.7)
Neutrophils Relative %: 61 % (ref 43–77)
Platelets: 268 10*3/uL (ref 150–400)
RBC: 4.57 MIL/uL (ref 3.87–5.11)
RDW: 13.4 % (ref 11.5–15.5)
WBC: 5.5 10*3/uL (ref 4.0–10.5)

## 2014-09-02 LAB — URINALYSIS, ROUTINE W REFLEX MICROSCOPIC
Bilirubin Urine: NEGATIVE
Glucose, UA: NEGATIVE mg/dL
Hgb urine dipstick: NEGATIVE
KETONES UR: 15 mg/dL — AB
LEUKOCYTES UA: NEGATIVE
NITRITE: NEGATIVE
PROTEIN: NEGATIVE mg/dL
Specific Gravity, Urine: 1.025 (ref 1.005–1.030)
Urobilinogen, UA: 0.2 mg/dL (ref 0.0–1.0)
pH: 6 (ref 5.0–8.0)

## 2014-09-02 LAB — LIPASE, BLOOD: Lipase: 25 U/L (ref 11–59)

## 2014-09-02 LAB — I-STAT TROPONIN, ED: Troponin i, poc: 0 ng/mL (ref 0.00–0.08)

## 2014-09-02 MED ORDER — HYDROMORPHONE HCL 1 MG/ML IJ SOLN
1.0000 mg | Freq: Once | INTRAMUSCULAR | Status: AC
  Administered 2014-09-02: 1 mg via INTRAVENOUS
  Filled 2014-09-02: qty 1

## 2014-09-02 MED ORDER — ONDANSETRON HCL 4 MG/2ML IJ SOLN
4.0000 mg | Freq: Once | INTRAMUSCULAR | Status: AC
  Administered 2014-09-02: 4 mg via INTRAVENOUS
  Filled 2014-09-02: qty 2

## 2014-09-02 MED ORDER — SODIUM CHLORIDE 0.9 % IV BOLUS (SEPSIS)
1000.0000 mL | Freq: Once | INTRAVENOUS | Status: AC
  Administered 2014-09-02: 1000 mL via INTRAVENOUS

## 2014-09-02 MED ORDER — HYDROMORPHONE HCL 1 MG/ML IJ SOLN
0.5000 mg | Freq: Once | INTRAMUSCULAR | Status: AC | PRN
  Administered 2014-09-02: 0.5 mg via INTRAVENOUS
  Filled 2014-09-02: qty 1

## 2014-09-02 MED ORDER — MORPHINE SULFATE 4 MG/ML IJ SOLN
4.0000 mg | Freq: Once | INTRAMUSCULAR | Status: AC
  Administered 2014-09-02: 4 mg via INTRAVENOUS
  Filled 2014-09-02: qty 1

## 2014-09-02 MED ORDER — IOHEXOL 300 MG/ML  SOLN
25.0000 mL | Freq: Once | INTRAMUSCULAR | Status: AC | PRN
  Administered 2014-09-02: 25 mL via ORAL

## 2014-09-02 NOTE — ED Notes (Signed)
Pt co "intense" ruq pain since 1830 with emesis after eating slice of pizza at 4859

## 2014-09-02 NOTE — ED Notes (Signed)
Ct notified pt completed contrast.  

## 2014-09-02 NOTE — ED Provider Notes (Signed)
CSN: [id number]     Arrival date & time 09/02/14  1954 History   First MD Initiated Contact with Patient 09/02/14 2001     Chief Complaint  Patient presents with  . Abdominal Pain     (Consider location/radiation/quality/duration/timing/severity/associated sxs/prior Treatment) HPI   Patient presents to the ED (PMH of peptic ulcer, fibroids, hypertension, high cholesterol) for severe abdominal pains 10/10 that started around 6:30 pm after eating a slice of pizza. She admits to having similar but much more mild pain two days ago. She describes the pain as sharp, gas-like pain, with nausea. It radiates across the top of her abdomen. She denies having hx of gallstones, kidney stones, chest pains, dissection, aneurysms. She has associated nausea, no SOB, diarrhea, diaphoresis, confusion, weakness, rectal bleeding, vaginal bleeding, dysuria.  Past Medical History  Diagnosis Date  . CIN I (cervical intraepithelial neoplasia I)   . Fibroid   . Elevated cholesterol   . Hypertension   . Postpartum care following cesarean delivery (8/2) 04/29/2012  . Ileus, postoperative 04/29/2012  . Thrombocytopenia 04/29/2012   Past Surgical History  Procedure Laterality Date  . Cesarean section      X3  . Colposcopy    . Myomectomy  04/25/2012    Procedure: MYOMECTOMY;  Surgeon: Marvene Staff, MD;  Location: [facility];  Service: Gynecology;  Laterality: N/A;  . Cystoscopy  04/25/2012    Procedure: CYSTOSCOPY;  Surgeon: Marvene Staff, MD;  Location: [facility];  Service: Gynecology;  Laterality: N/A;   Family History  Problem Relation Age of Onset  . Hypertension Mother   . Hypertension Father   . Stroke Father   . Diabetes Maternal Grandmother   . Heart disease Paternal Grandfather    History  Substance Use Topics  . Smoking status: Never Smoker   . Smokeless tobacco: Not on file  . Alcohol Use: No     Comment: rare   OB History    Gravida Para Term Preterm AB TAB SAB Ectopic Multiple Living     6 3 2 1 3 1 2   3      Review of Systems  10 Systems reviewed and are negative for acute change except as noted in the HPI.     Allergies  Review of patient's allergies indicates no known allergies.  Home Medications   Prior to Admission medications   Medication Sig Start Date End Date Taking? Authorizing Provider  amLODipine (NORVASC) 10 MG tablet Take 10 mg by mouth daily.   Yes Historical Provider, MD  amLODipine (NORVASC) 10 MG tablet Take 1 tablet (10 mg total) by mouth daily. 05/02/12 05/02/13  Darleen Crocker, NP  ferrous sulfate 325 (65 FE) MG tablet Take 1 tablet (325 mg total) by mouth 2 (two) times daily with a meal. 05/02/12 05/02/13  Darleen Crocker, NP  hydrALAZINE (APRESOLINE) 10 MG tablet Take 1 tablet (10 mg total) by mouth every 6 (six) hours. 05/02/12 05/02/13  Darleen Crocker, NP  HYDROcodone-acetaminophen (NORCO/VICODIN) 5-325 MG per tablet Take 1 tablet by mouth every 4 (four) hours as needed. 09/03/14   Kazimierz Springborn Marilu Favre, PA-C  NIFEdipine (PROCARDIA XL/ADALAT-CC) 30 MG 24 hr tablet Take 1 tablet (30 mg total) by mouth daily. 03/21/12 03/21/13  Darleen Crocker, NP  ondansetron (ZOFRAN) 4 MG tablet Take 1 tablet (4 mg total) by mouth every 6 (six) hours. 09/03/14   Rhealynn Myhre Marilu Favre, PA-C   BP 124/80 mmHg  Pulse 68  Temp(Src) 98.1 F (36.7  C) (Oral)  Resp 19  Ht 5\' 1"  (1.549 m)  Wt 140 lb (63.504 kg)  BMI 26.47 kg/m2  SpO2 98%  LMP 08/06/2014 Physical Exam  Constitutional: She appears well-developed and well-nourished. She appears distressed.  HENT:  Head: Normocephalic and atraumatic.  Eyes: Pupils are equal, round, and reactive to light.  Neck: Normal range of motion. Neck supple.  Cardiovascular: Normal rate and regular rhythm.   Pulmonary/Chest: Effort normal.  Abdominal: Soft. Bowel sounds are normal. She exhibits distension (mild). There is tenderness (pt is diffusely tender, but the pain is worse to the RUQ and epigastric region). There is no rebound and no  guarding.    Exam limited by omentum.  Neurological: She is alert.  Skin: Skin is warm and dry.  Nursing note and vitals reviewed.   ED Course  Procedures (including critical care time) Labs Review Labs Reviewed  COMPREHENSIVE METABOLIC PANEL - Abnormal; Notable for the following:    Total Bilirubin 0.2 (*)    All other components within normal limits  URINALYSIS, ROUTINE W REFLEX MICROSCOPIC - Abnormal; Notable for the following:    Ketones, ur 15 (*)    All other components within normal limits  CBC WITH DIFFERENTIAL  LIPASE, BLOOD  I-STAT TROPOININ, ED    Imaging Review Ct Abdomen Pelvis W Contrast  09/03/2014   CLINICAL DATA:  Mid to left upper abdominal pain with nausea and vomiting starting at 630 tonight.  EXAM: CT ABDOMEN AND PELVIS WITH CONTRAST  TECHNIQUE: Multidetector CT imaging of the abdomen and pelvis was performed using the standard protocol following bolus administration of intravenous contrast.  CONTRAST:  22mL OMNIPAQUE IOHEXOL 300 MG/ML  SOLN  COMPARISON:  None.  FINDINGS: Lung bases are clear.  Small esophageal hiatal hernia. The liver, spleen, gallbladder, pancreas, adrenal glands, kidneys, abdominal aorta, inferior vena cava, and retroperitoneal lymph nodes are unremarkable. Stomach is decompressed. Small bowel are not abnormally distended. There is a broad-based moderate-sized umbilical hernia containing portion of the transverse colon. No evidence of proximal obstruction. Diffusely stool-filled colon without definitive wall thickening.  Pelvis: Appendix is normal. Enlarged left ovary containing a cystic structure measuring about 3.6 cm. Mild increased density suggest possible hemorrhagic cyst. Recommend followup ultrasound in 6-12 weeks for further evaluation. Normal sized uterus. Right ovary is not enlarged. Bladder is decompressed without significant wall thickening. No free or loculated pelvic fluid collections. No destructive bone lesions. Stool-filled  rectosigmoid colon. No evidence of diverticulitis.  IMPRESSION: Broad-based umbilical hernia containing transverse colon without evidence of proximal obstruction. 3.6 cm complex left ovarian cyst probably representing a hemorrhagic cyst. Suggest follow-up ultrasound in 6-12 weeks for further evaluation.   Electronically Signed   By: Lucienne Capers M.D.   On: 09/03/2014 00:52   Dg Abd Acute W/chest  09/02/2014   CLINICAL DATA:  Epigastric pain and vomiting tonight.  EXAM: ACUTE ABDOMEN SERIES (ABDOMEN 2 VIEW & CHEST 1 VIEW)  COMPARISON:  04/27/2012  FINDINGS: Calcified granulomas in the lungs. Normal heart size and pulmonary vascularity. No focal airspace disease or consolidation in the lungs. No blunting of costophrenic angles. No pneumothorax. Mediastinal contours appear intact.  Scattered gas and stool in the colon. No small or large bowel distention. No free intra-abdominal air. No abnormal air-fluid levels. No radiopaque stones. Visualized bones appear intact.  IMPRESSION: Negative abdominal radiographs.  No acute cardiopulmonary disease.   Electronically Signed   By: Lucienne Capers M.D.   On: 09/02/2014 22:22     EKG Interpretation  Date/Time:  Thursday September 02 2014 22:48:25 EST Ventricular Rate:  66 PR Interval:  142 QRS Duration: 76 QT Interval:  389 QTC Calculation: 407 R Axis:   91 Text Interpretation:  Sinus rhythm Borderline right axis deviation No  significant change since Confirmed by HARRISON  MD, FORREST ([id number]) on  09/02/2014 8:55:36 PM      MDM   Final diagnoses:  Right upper quadrant abdominal pain  Abdominal pain  Umbilical hernia without obstruction and without gangrene    Patients CT scan does explain patients pain. However, she went from severe pain that improved only mildly with pain medication. Then, there was a point where she felt a "pop" and her pain improved. She no longer needed anymore pain medication and felt better. Due to CT scan showing  umbilical hernia, I question whether the hernia and strangulated and self resolved. I explained the possibilities to the patient. Discussed ways to build up her abdominal wall muscle. Also discussed need to return to the ED if the symptoms reoccur. Otherwise, her physical exam is unremarkable.  Medications  sodium chloride 0.9 % bolus 1,000 mL (0 mLs Intravenous Stopped 09/02/14 2138)  ondansetron (ZOFRAN) injection 4 mg (4 mg Intravenous Given 09/02/14 2034)  morphine 4 MG/ML injection 4 mg (4 mg Intravenous Given 09/02/14 2034)  HYDROmorphone (DILAUDID) injection 1 mg (1 mg Intravenous Given 09/02/14 2043)  HYDROmorphone (DILAUDID) injection 0.5 mg (0.5 mg Intravenous Given 09/02/14 2228)  iohexol (OMNIPAQUE) 300 MG/ML solution 25 mL (25 mLs Oral Contrast Given 09/02/14 2341)  ondansetron (ZOFRAN) injection 4 mg (4 mg Intravenous Given 09/02/14 2337)  iohexol (OMNIPAQUE) 300 MG/ML solution 80 mL (80 mLs Intravenous Contrast Given 09/03/14 0025)     Patient tolerated PO prior to discharge. Feels comfortable with the plan. Referred to [facility] and given work note/pain medications.  48 y.o.[name]'s evaluation in the Emergency Department is complete. It has been determined that no acute conditions requiring further emergency intervention are present at this time. The patient/guardian have been advised of the diagnosis and plan. We have discussed signs and symptoms that warrant return to the ED, such as changes or worsening in symptoms.  Vital signs are stable at discharge. Filed Vitals:   09/02/14 2130  BP: 124/80  Pulse: 68  Temp:   Resp: 19    Patient/guardian has voiced understanding and agreed to follow-up with the PCP or specialist.    Linus Mako, PA-C 09/03/14 1809  Pamella Pert, MD 09/03/14 1956

## 2014-09-02 NOTE — ED Notes (Signed)
Tiffany, PA at bedside.

## 2014-09-03 ENCOUNTER — Encounter (HOSPITAL_COMMUNITY): Payer: Self-pay | Admitting: Radiology

## 2014-09-03 ENCOUNTER — Emergency Department (HOSPITAL_COMMUNITY): Payer: BC Managed Care – PPO

## 2014-09-03 MED ORDER — IOHEXOL 300 MG/ML  SOLN
80.0000 mL | Freq: Once | INTRAMUSCULAR | Status: AC | PRN
  Administered 2014-09-03: 80 mL via INTRAVENOUS

## 2014-09-03 MED ORDER — HYDROCODONE-ACETAMINOPHEN 5-325 MG PO TABS: 1.0000 | ORAL_TABLET | ORAL | Status: DC | PRN

## 2014-09-03 MED ORDER — ONDANSETRON HCL 4 MG PO TABS: 4.0000 mg | ORAL_TABLET | Freq: Four times a day (QID) | ORAL | Status: DC

## 2014-09-03 NOTE — ED Notes (Signed)
Alessandria Henken PA at bedside.

## 2014-09-03 NOTE — ED Notes (Signed)
Discharge instructions reviewed with pt. Pt verbalized understanding.   

## 2014-09-03 NOTE — Discharge Instructions (Signed)

## 2015-04-22 ENCOUNTER — Encounter (HOSPITAL_COMMUNITY): Payer: Self-pay

## 2015-04-22 ENCOUNTER — Emergency Department (INDEPENDENT_AMBULATORY_CARE_PROVIDER_SITE_OTHER)
Admission: EM | Admit: 2015-04-22 | Discharge: 2015-04-22 | Disposition: A | Discharge status: Home / Self Care | Payer: BC Managed Care – PPO | Source: Home / Self Care

## 2015-04-22 DIAGNOSIS — R1031 Right lower quadrant pain: Secondary | ICD-10-CM

## 2015-04-22 NOTE — ED Provider Notes (Signed)
CSN: [id number]     Arrival date & time 04/22/15  1959 History   None    Chief Complaint  Patient presents with  . Abdominal Pain   (Consider location/radiation/quality/duration/timing/severity/associated sxs/prior Treatment)  HPI   The patient is a 49 year old female presents tonight with complaints of abdominal pain onset at 6:30 this evening. Patient reports positive nausea, but denies vomiting or diarrhea.  Last bowel movement was this afternoon and patient reports it as normal. Patient states she has a significant medical history for a possible strangulated umbilical hernia which spontaneously resolved.  The patient describes the pain as initially starting underneath bilateral rib cages. Patient states that the pain was nonradiating in nature and resolved on its own when she arrived here to the urgent care.   Past Medical History  Diagnosis Date  . CIN I (cervical intraepithelial neoplasia I)   . Fibroid   . Elevated cholesterol   . Hypertension   . Postpartum care following cesarean delivery (8/2) 04/29/2012  . Ileus, postoperative 04/29/2012  . Thrombocytopenia 04/29/2012   Past Surgical History  Procedure Laterality Date  . Cesarean section      X3  . Colposcopy    . Myomectomy  04/25/2012    Procedure: MYOMECTOMY;  Surgeon: Marvene Staff, MD;  Location: [facility];  Service: Gynecology;  Laterality: N/A;  . Cystoscopy  04/25/2012    Procedure: CYSTOSCOPY;  Surgeon: Marvene Staff, MD;  Location: [facility];  Service: Gynecology;  Laterality: N/A;   Family History  Problem Relation Age of Onset  . Hypertension Mother   . Hypertension Father   . Stroke Father   . Diabetes Maternal Grandmother   . Heart disease Paternal Grandfather    History  Substance Use Topics  . Smoking status: Never Smoker   . Smokeless tobacco: Not on file  . Alcohol Use: No     Comment: rare   OB History    Gravida Para Term Preterm AB TAB SAB Ectopic Multiple Living   6 3 2 1 3 1 2   3       Review of Systems  Constitutional: Negative.  Negative for fever and chills.  HENT: Negative for sinus pressure, sneezing and sore throat.   Eyes: Negative.   Respiratory: Negative.  Negative for cough, shortness of breath and wheezing.   Cardiovascular: Negative.   Gastrointestinal: Positive for nausea and abdominal pain. Negative for vomiting, diarrhea and abdominal distention.  Endocrine: Negative.   Genitourinary: Negative.  Negative for dysuria, urgency, frequency, hematuria, flank pain, vaginal bleeding, vaginal discharge, difficulty urinating, genital sores, vaginal pain and pelvic pain.  Musculoskeletal: Negative.   Skin: Negative.  Negative for pallor and rash.  Allergic/Immunologic: Negative.   Neurological: Positive for headaches. Negative for dizziness and weakness.  Hematological: Negative.   Psychiatric/Behavioral: Negative.     Allergies  Review of patient's allergies indicates no known allergies.  Home Medications   Prior to Admission medications   Medication Sig Start Date End Date Taking? Authorizing Provider  amLODipine (NORVASC) 10 MG tablet Take 1 tablet (10 mg total) by mouth daily. 05/02/12 05/02/13  Gustavo Lah, NP  amLODipine (NORVASC) 10 MG tablet Take 10 mg by mouth daily.    Historical Provider, MD  ferrous sulfate 325 (65 FE) MG tablet Take 1 tablet (325 mg total) by mouth 2 (two) times daily with a meal. 05/02/12 05/02/13  Gustavo Lah, NP  hydrALAZINE (APRESOLINE) 10 MG tablet Take 1 tablet (10 mg total) by mouth every 6 (  six) hours. 05/02/12 05/02/13  Gustavo Lah, NP  HYDROcodone-acetaminophen (NORCO/VICODIN) 5-325 MG per tablet Take 1 tablet by mouth every 4 (four) hours as needed. 09/03/14   Tiffany Carlota Raspberry, PA-C  NIFEdipine (PROCARDIA XL/ADALAT-CC) 30 MG 24 hr tablet Take 1 tablet (30 mg total) by mouth daily. 03/21/12 03/21/13  Gustavo Lah, NP  ondansetron (ZOFRAN) 4 MG tablet Take 1 tablet (4 mg total) by mouth every 6 (six) hours. 09/03/14   Tiffany Carlota Raspberry,  PA-C   BP 127/80 mmHg  Pulse 59  Temp(Src) 99 F (37.2 C) (Oral)  SpO2 99%   Physical Exam  Constitutional: She is oriented to person, place, and time. She appears well-developed and well-nourished. No distress.  Eyes: Conjunctivae are normal. Pupils are equal, round, and reactive to light. Right eye exhibits no discharge. Left eye exhibits no discharge. No scleral icterus.  Neck: Normal range of motion. Neck supple.  Cardiovascular: Normal rate, regular rhythm, normal heart sounds and intact distal pulses.  Exam reveals no gallop and no friction rub.   No murmur heard. Pulmonary/Chest: Effort normal and breath sounds normal. No respiratory distress. She has no wheezes. She has no rales. She exhibits no tenderness.  Abdominal: Soft. Bowel sounds are normal. She exhibits no distension and no mass. There is no hepatosplenomegaly, splenomegaly or hepatomegaly. There is tenderness in the right lower quadrant. There is rebound and tenderness at McBurney's point. There is no rigidity, no guarding, no CVA tenderness and negative Murphy's sign. Hernia confirmed negative in the ventral area, confirmed negative in the right inguinal area and confirmed negative in the left inguinal area.  Small reducible umbilical hernia noted. Patient denies discomfort with palpation.  Neurological: She is alert and oriented to person, place, and time.  Skin: Skin is warm and dry. She is not diaphoretic.  Nursing note and vitals reviewed. The patient is able to jump from exam table without complaints of abdominal pain.   ED Course  Procedures (including critical care time) Labs Review Labs Reviewed - No data to display  Imaging Review No results found.   MDM   1. Right lower quadrant abdominal pain    Discussed the possibility of acute appendicitis with patient.  The patient does not wish to go to emergency department for evaluation this evening. Patient understands the importance of returning to emergency  department with recurrence of signs and symptoms. Patient states that she will follow up with primary care provider ASAP regardless of whether symptoms return.      Nehemiah Settle, NP 04/22/15 2133

## 2015-04-22 NOTE — ED Notes (Signed)
C/o a lot of abdominal pain just PTA, but pain went away just after arrival. C/o mild residual tenderness, states she belched, had a BM

## 2015-04-22 NOTE — Discharge Instructions (Signed)
Return to ED immediately if symptoms return, otherwise follow up with Dr. Criss Rosales first thing in the morning.   Abdominal Pain Many things can cause abdominal pain. Usually, abdominal pain is not caused by a disease and will improve without treatment. It can often be observed and treated at home. Your health care provider will do a physical exam and possibly order blood tests and X-rays to help determine the seriousness of your pain. However, in many cases, more time must pass before a clear cause of the pain can be found. Before that point, your health care provider may not know if you need more testing or further treatment. HOME CARE INSTRUCTIONS  Monitor your abdominal pain for any changes. The following actions may help to alleviate any discomfort you are experiencing:  Only take over-the-counter or prescription medicines as directed by your health care provider.  Do not take laxatives unless directed to do so by your health care provider.  Try a clear liquid diet (broth, tea, or water) as directed by your health care provider. Slowly move to a bland diet as tolerated. SEEK MEDICAL CARE IF:  You have unexplained abdominal pain.  You have abdominal pain associated with nausea or diarrhea.  You have pain when you urinate or have a bowel movement.  You experience abdominal pain that wakes you in the night.  You have abdominal pain that is worsened or improved by eating food.  You have abdominal pain that is worsened with eating fatty foods.  You have a fever. SEEK IMMEDIATE MEDICAL CARE IF:   Your pain does not go away within 2 hours.  You keep throwing up (vomiting).  Your pain is felt only in portions of the abdomen, such as the right side or the left lower portion of the abdomen.  You pass bloody or black tarry stools. MAKE SURE YOU:  Understand these instructions.   Will watch your condition.   Will get help right away if you are not doing well or get worse.  Document  Released: 06/20/2005 Document Revised: 09/15/2013 Document Reviewed: 05/20/2013 [facility] Patient Information 2015 [place], [place]. This information is not intended to replace advice given to you by your health care provider. Make sure you discuss any questions you have with your health care provider.

## 2015-04-26 ENCOUNTER — Other Ambulatory Visit: Payer: Self-pay | Admitting: Family Medicine

## 2015-04-26 DIAGNOSIS — R1084 Generalized abdominal pain: Secondary | ICD-10-CM

## 2015-04-27 ENCOUNTER — Ambulatory Visit
Admission: RE | Admit: 2015-04-27 | Discharge: 2015-04-27 | Disposition: A | Discharge status: Home / Self Care | Payer: BC Managed Care – PPO | Source: Ambulatory Visit | Attending: Family Medicine | Admitting: Family Medicine

## 2015-04-27 DIAGNOSIS — R1084 Generalized abdominal pain: Secondary | ICD-10-CM

## 2015-06-30 ENCOUNTER — Emergency Department (HOSPITAL_COMMUNITY)
Admission: EM | Admit: 2015-06-30 | Discharge: 2015-07-01 | Disposition: A | Discharge status: Home / Self Care | Payer: BC Managed Care – PPO | Attending: Emergency Medicine | Admitting: Emergency Medicine

## 2015-06-30 ENCOUNTER — Encounter (HOSPITAL_COMMUNITY): Payer: Self-pay | Admitting: Emergency Medicine

## 2015-06-30 DIAGNOSIS — Z862 Personal history of diseases of the blood and blood-forming organs and certain disorders involving the immune mechanism: Secondary | ICD-10-CM | POA: Insufficient documentation

## 2015-06-30 DIAGNOSIS — I10 Essential (primary) hypertension: Secondary | ICD-10-CM | POA: Insufficient documentation

## 2015-06-30 DIAGNOSIS — R1912 Hyperactive bowel sounds: Secondary | ICD-10-CM | POA: Insufficient documentation

## 2015-06-30 DIAGNOSIS — Z3202 Encounter for pregnancy test, result negative: Secondary | ICD-10-CM | POA: Diagnosis not present

## 2015-06-30 DIAGNOSIS — Z8719 Personal history of other diseases of the digestive system: Secondary | ICD-10-CM | POA: Insufficient documentation

## 2015-06-30 DIAGNOSIS — Z86018 Personal history of other benign neoplasm: Secondary | ICD-10-CM | POA: Diagnosis not present

## 2015-06-30 DIAGNOSIS — Z79899 Other long term (current) drug therapy: Secondary | ICD-10-CM | POA: Diagnosis not present

## 2015-06-30 DIAGNOSIS — R110 Nausea: Secondary | ICD-10-CM | POA: Insufficient documentation

## 2015-06-30 DIAGNOSIS — Z87410 Personal history of cervical dysplasia: Secondary | ICD-10-CM | POA: Diagnosis not present

## 2015-06-30 DIAGNOSIS — R1084 Generalized abdominal pain: Secondary | ICD-10-CM | POA: Diagnosis not present

## 2015-06-30 DIAGNOSIS — Z8639 Personal history of other endocrine, nutritional and metabolic disease: Secondary | ICD-10-CM | POA: Diagnosis not present

## 2015-06-30 DIAGNOSIS — R1010 Upper abdominal pain, unspecified: Secondary | ICD-10-CM | POA: Diagnosis present

## 2015-06-30 DIAGNOSIS — R109 Unspecified abdominal pain: Secondary | ICD-10-CM

## 2015-06-30 HISTORY — DX: Umbilical hernia without obstruction or gangrene: K42.9

## 2015-06-30 LAB — COMPREHENSIVE METABOLIC PANEL
ALK PHOS: 42 U/L (ref 38–126)
ALT: 12 U/L — ABNORMAL LOW (ref 14–54)
AST: 18 U/L (ref 15–41)
Albumin: 3.9 g/dL (ref 3.5–5.0)
Anion gap: 9 (ref 5–15)
BILIRUBIN TOTAL: 0.4 mg/dL (ref 0.3–1.2)
BUN: 12 mg/dL (ref 6–20)
CALCIUM: 9.6 mg/dL (ref 8.9–10.3)
CO2: 25 mmol/L (ref 22–32)
CREATININE: 0.73 mg/dL (ref 0.44–1.00)
Chloride: 103 mmol/L (ref 101–111)
GFR calc Af Amer: >60 mL/min (ref >60)
GFR calc non Af Amer: >60 mL/min (ref >60)
Glucose, Bld: 99 mg/dL (ref 65–99)
Potassium: 3.8 mmol/L (ref 3.5–5.1)
Sodium: 137 mmol/L (ref 135–145)
Total Protein: 6.8 g/dL (ref 6.5–8.1)

## 2015-06-30 LAB — CBC
HCT: 37.3 % (ref 36.0–46.0)
Hemoglobin: 12.2 g/dL (ref 12.0–15.0)
MCH: 27.3 pg (ref 26.0–34.0)
MCHC: 32.7 g/dL (ref 30.0–36.0)
MCV: 83.4 fL (ref 78.0–100.0)
PLATELETS: 277 10*3/uL (ref 150–400)
RBC: 4.47 MIL/uL (ref 3.87–5.11)
RDW: 13.4 % (ref 11.5–15.5)
WBC: 5.3 10*3/uL (ref 4.0–10.5)

## 2015-06-30 LAB — URINALYSIS, ROUTINE W REFLEX MICROSCOPIC
BILIRUBIN URINE: NEGATIVE
Glucose, UA: NEGATIVE mg/dL
KETONES UR: NEGATIVE mg/dL
NITRITE: NEGATIVE
PH: 8 (ref 5.0–8.0)
Protein, ur: NEGATIVE mg/dL
Specific Gravity, Urine: 1.017 (ref 1.005–1.030)
Urobilinogen, UA: 1 mg/dL (ref 0.0–1.0)

## 2015-06-30 LAB — URINE MICROSCOPIC-ADD ON

## 2015-06-30 LAB — POC URINE PREG, ED: Preg Test, Ur: NEGATIVE

## 2015-06-30 LAB — LIPASE, BLOOD: Lipase: 29 U/L (ref 22–51)

## 2015-06-30 MED ORDER — FENTANYL CITRATE (PF) 100 MCG/2ML IJ SOLN
INTRAMUSCULAR | Status: AC
  Filled 2015-06-30: qty 2

## 2015-06-30 MED ORDER — MORPHINE SULFATE (PF) 4 MG/ML IV SOLN
4.0000 mg | Freq: Once | INTRAVENOUS | Status: AC
  Administered 2015-07-01: 4 mg via INTRAVENOUS
  Filled 2015-06-30: qty 1

## 2015-06-30 MED ORDER — FENTANYL CITRATE (PF) 100 MCG/2ML IJ SOLN
50.0000 ug | Freq: Once | INTRAMUSCULAR | Status: AC
  Administered 2015-06-30: 50 ug via INTRAVENOUS

## 2015-06-30 MED ORDER — SODIUM CHLORIDE 0.9 % IV BOLUS (SEPSIS)
1000.0000 mL | Freq: Once | INTRAVENOUS | Status: AC
  Administered 2015-07-01: 1000 mL via INTRAVENOUS

## 2015-06-30 MED ORDER — ONDANSETRON HCL 4 MG/2ML IJ SOLN
4.0000 mg | Freq: Once | INTRAMUSCULAR | Status: AC
  Administered 2015-07-01: 4 mg via INTRAVENOUS
  Filled 2015-06-30: qty 2

## 2015-06-30 NOTE — ED Provider Notes (Signed)
CSN: [id number]     Arrival date & time 06/30/15  2111 History   By signing my name below, I, [name], attest that this documentation has been prepared under the direction and in the presence of Varney Biles, MD. Electronically Signed: Forrestine Young, ED Scribe. 06/30/2015. 11:59 PM.   Chief Complaint  Patient presents with  . Abdominal Pain   The history is provided by the patient. No language interpreter was used.    HPI Comments: [name] is a 49 y.o. female with a PMHx of fibroid, HTN, umbilical hernia, and ileus who presents to the Emergency Department complaining of constant, gradually worsening, ongoing upper abdominal pain onset 4:00 PM this afternoon. Associated nausea also reported. No aggravating or alleviating factors at this time. No OTC medications or home remedies attempted prior to arrival. Denies any fever, chills, vomiting, or diarrhea. Last normal bowel movement today. PSHx includes C-section x 3. No known allergies to medications.  Past Medical History  Diagnosis Date  . CIN I (cervical intraepithelial neoplasia I)   . Fibroid   . Elevated cholesterol   . Hypertension   . Postpartum care following cesarean delivery (8/2) 04/29/2012  . Ileus, postoperative 04/29/2012  . Thrombocytopenia ([place]) 04/29/2012  . Umbilical hernia    Past Surgical History  Procedure Laterality Date  . Cesarean section      X3  . Colposcopy    . Myomectomy  04/25/2012    Procedure: MYOMECTOMY;  Surgeon: Marvene Staff, MD;  Location: [facility];  Service: Gynecology;  Laterality: N/A;  . Cystoscopy  04/25/2012    Procedure: CYSTOSCOPY;  Surgeon: Marvene Staff, MD;  Location: [facility];  Service: Gynecology;  Laterality: N/A;   Family History  Problem Relation Age of Onset  . Hypertension Mother   . Hypertension Father   . Stroke Father   . Diabetes Maternal Grandmother   . Heart disease Paternal Grandfather    Social History  Substance Use Topics  . Smoking status: Never  Smoker   . Smokeless tobacco: None  . Alcohol Use: No   OB History    Gravida Para Term Preterm AB TAB SAB Ectopic Multiple Living   6 3 2 1 3 1 2   3      Review of Systems  Constitutional: Negative for fever and chills.  Respiratory: Negative for cough and shortness of breath.   Cardiovascular: Negative for chest pain.  Gastrointestinal: Positive for nausea and abdominal pain. Negative for vomiting and diarrhea.  Genitourinary: Negative for dysuria.  Musculoskeletal: Negative for back pain.  Skin: Negative for rash.  Neurological: Negative for headaches.  Psychiatric/Behavioral: Negative for confusion.  All other systems reviewed and are negative.     Allergies  Review of patient's allergies indicates no known allergies.  Home Medications   Prior to Admission medications   Medication Sig Start Date End Date Taking? Authorizing Provider  amLODipine (NORVASC) 10 MG tablet Take 10 mg by mouth daily.   Yes Historical Provider, MD  valsartan-hydrochlorothiazide (DIOVAN-HCT) 80-12.5 MG tablet Take 1 tablet by mouth daily. 06/01/15  Yes Historical Provider, MD  ibuprofen (ADVIL,MOTRIN) 600 MG tablet Take 1 tablet (600 mg total) by mouth every 6 (six) hours as needed. 07/01/15   Varney Biles, MD  promethazine (PHENERGAN) 25 MG tablet Take 1 tablet (25 mg total) by mouth every 6 (six) hours as needed for nausea. 07/01/15   Varney Biles, MD  traMADol (ULTRAM) 50 MG tablet Take 1 tablet (50 mg total) by  mouth every 6 (six) hours as needed. 07/01/15   Varney Biles, MD   Triage Vitals: BP 108/69 mmHg  Pulse 61  Temp(Src) 97.6 F (36.4 C) (Oral)  Resp 16  SpO2 99%  LMP    Physical Exam  Constitutional: She is oriented to person, place, and time. She appears well-developed and well-nourished. No distress.  HENT:  Head: Normocephalic and atraumatic.  Eyes: EOM are normal.  Neck: Normal range of motion.  Cardiovascular: Normal rate, regular rhythm and normal heart sounds.    Pulmonary/Chest: Effort normal and breath sounds normal.  Lungs are clear to auscultation   Abdominal: Soft. She exhibits no distension. There is tenderness.  Hyperactive bowel sounds noted No protruding umbilical hernia or mass noted Positive McBurney's point with diffuse tenderness spaning the lower quadrants and RUQ  Musculoskeletal: Normal range of motion.  Neurological: She is alert and oriented to person, place, and time.  Skin: Skin is warm and dry.  Psychiatric: She has a normal mood and affect. Judgment normal.  Nursing note and vitals reviewed.   ED Course  Procedures (including critical care time)  DIAGNOSTIC STUDIES: Oxygen Saturation is 100% on RA, Normal by my interpretation.    COORDINATION OF CARE: 11:58 PM-  Will order CT abdomen pelvis with contrast, lipase, CMP, CBC, urinalysis, and urine pregnancy. Discussed treatment plan with pt at bedside and pt agreed to plan.     Labs Review Labs Reviewed  COMPREHENSIVE METABOLIC PANEL - Abnormal; Notable for the following:    ALT 12 (*)    All other components within normal limits  URINALYSIS, ROUTINE W REFLEX MICROSCOPIC (NOT AT [facility]) - Abnormal; Notable for the following:    APPearance TURBID (*)    Hgb urine dipstick SMALL (*)    Leukocytes, UA MODERATE (*)    All other components within normal limits  URINE MICROSCOPIC-ADD ON - Abnormal; Notable for the following:    Bacteria, UA MANY (*)    All other components within normal limits  LIPASE, BLOOD  CBC  POC URINE PREG, ED    Imaging Review US Transvaginal Non-ob  07/01/2015   CLINICAL DATA:  Left-sided abdominal pain. Presumed ovarian cyst noted on current abdomen and pelvis CT.  EXAM: TRANSABDOMINAL AND TRANSVAGINAL ULTRASOUND OF PELVIS  DOPPLER ULTRASOUND OF OVARIES  TECHNIQUE: Both transabdominal and transvaginal ultrasound examinations of the pelvis were performed. Transabdominal technique was performed for global imaging of the pelvis including uterus,  ovaries, adnexal regions, and pelvic cul-de-sac.  It was necessary to proceed with endovaginal exam following the transabdominal exam to visualize the ovaries to better advantage. Color and duplex Doppler ultrasound was utilized to evaluate blood flow to the ovaries.  COMPARISON:  Current abdomen and pelvis CT  FINDINGS: Uterus  Measurements: 10.1 x 5.8 x 6.1 cm. No fibroids or other mass visualized.  Endometrium  Thickness: 8 mm.  No focal abnormality visualized.  Right ovary  Measurements: 3.5 x 2.0 x 2.8 cm. Normal appearance/no adnexal mass.  Left ovary  Measurements: 3.5 x 2.9 x 3.6 cm. Normal appearance/no adnexal mass.  Pulsed Doppler evaluation of both ovaries demonstrates normal low-resistance arterial and venous waveforms.  Other findings  No free fluid.  IMPRESSION: 1. Normal exam. 2. Apparent ovarian cyst suggests on CT is not visualized sonographically. The CT finding likely reflected a normal ovary with a normal involuting follicular cyst. This is unlikely to be the source of this patient's symptoms. No evidence of ovarian torsion.   Electronically Signed   By:  Lajean Manes M.D.   On: 07/01/2015 07:47   US Pelvis Complete  07/01/2015   CLINICAL DATA:  Left-sided abdominal pain. Presumed ovarian cyst noted on current abdomen and pelvis CT.  EXAM: TRANSABDOMINAL AND TRANSVAGINAL ULTRASOUND OF PELVIS  DOPPLER ULTRASOUND OF OVARIES  TECHNIQUE: Both transabdominal and transvaginal ultrasound examinations of the pelvis were performed. Transabdominal technique was performed for global imaging of the pelvis including uterus, ovaries, adnexal regions, and pelvic cul-de-sac.  It was necessary to proceed with endovaginal exam following the transabdominal exam to visualize the ovaries to better advantage. Color and duplex Doppler ultrasound was utilized to evaluate blood flow to the ovaries.  COMPARISON:  Current abdomen and pelvis CT  FINDINGS: Uterus  Measurements: 10.1 x 5.8 x 6.1 cm. No fibroids or other  mass visualized.  Endometrium  Thickness: 8 mm.  No focal abnormality visualized.  Right ovary  Measurements: 3.5 x 2.0 x 2.8 cm. Normal appearance/no adnexal mass.  Left ovary  Measurements: 3.5 x 2.9 x 3.6 cm. Normal appearance/no adnexal mass.  Pulsed Doppler evaluation of both ovaries demonstrates normal low-resistance arterial and venous waveforms.  Other findings  No free fluid.  IMPRESSION: 1. Normal exam. 2. Apparent ovarian cyst suggests on CT is not visualized sonographically. The CT finding likely reflected a normal ovary with a normal involuting follicular cyst. This is unlikely to be the source of this patient's symptoms. No evidence of ovarian torsion.   Electronically Signed   By: Lajean Manes M.D.   On: 07/01/2015 07:47   Ct Abdomen Pelvis W Contrast  07/01/2015   CLINICAL DATA:  Severe upper abdominal pain with nausea.  EXAM: CT ABDOMEN AND PELVIS WITH CONTRAST  TECHNIQUE: Multidetector CT imaging of the abdomen and pelvis was performed using the standard protocol following bolus administration of intravenous contrast.  CONTRAST:  18mL OMNIPAQUE IOHEXOL 300 MG/ML  SOLN  COMPARISON:  09/03/2014  FINDINGS: Lower chest:  No significant abnormality  Hepatobiliary: There are normal appearances of the liver, gallbladder and bile ducts.  Pancreas: Normal  Spleen: Normal  Adrenals/Urinary Tract: The adrenals and kidneys are normal in appearance. There is no urinary calculus evident. There is no hydronephrosis or ureteral dilatation. Collecting systems and ureters appear unremarkable.  Stomach/Bowel: The appendix is normal. Colon is unremarkable. There is enhancement of distal small bowel without evidence of obstruction. This is nonspecific but could represent enteritis. This is particularly prominent in the right lower quadrant. There is no significant bowel dilatation. There is no extraluminal air. A portion of the mid transverse colon protrudes into a wide-mouth umbilical hernia without obstruction.   Vascular/Lymphatic: The abdominal aorta is normal in caliber. There is no atherosclerotic calcification. There is no adenopathy in the abdomen or pelvis.  Reproductive: There is a complex 3.3 cm left adnexal cyst. Uterus and right ovary are unremarkable.  Other: No ascites.  Musculoskeletal: No significant musculoskeletal lesions.  IMPRESSION: 1. Nonspecific nonobstructive mural enhancement of distal small bowel. This could represent enteritis. 2. Complex 3.3 cm left adnexal cyst, similar to the 09/03/2014 exam although there now is not as much adjacent fluid. Consider pelvic sonography for characterization.   Electronically Signed   By: Andreas Newport M.D.   On: 07/01/2015 03:10   [place] Art/ven Flow Abd Pelv Doppler  07/01/2015   CLINICAL DATA:  Left-sided abdominal pain. Presumed ovarian cyst noted on current abdomen and pelvis CT.  EXAM: TRANSABDOMINAL AND TRANSVAGINAL ULTRASOUND OF PELVIS  DOPPLER ULTRASOUND OF OVARIES  TECHNIQUE: Both transabdominal and transvaginal  ultrasound examinations of the pelvis were performed. Transabdominal technique was performed for global imaging of the pelvis including uterus, ovaries, adnexal regions, and pelvic cul-de-sac.  It was necessary to proceed with endovaginal exam following the transabdominal exam to visualize the ovaries to better advantage. Color and duplex Doppler ultrasound was utilized to evaluate blood flow to the ovaries.  COMPARISON:  Current abdomen and pelvis CT  FINDINGS: Uterus  Measurements: 10.1 x 5.8 x 6.1 cm. No fibroids or other mass visualized.  Endometrium  Thickness: 8 mm.  No focal abnormality visualized.  Right ovary  Measurements: 3.5 x 2.0 x 2.8 cm. Normal appearance/no adnexal mass.  Left ovary  Measurements: 3.5 x 2.9 x 3.6 cm. Normal appearance/no adnexal mass.  Pulsed Doppler evaluation of both ovaries demonstrates normal low-resistance arterial and venous waveforms.  Other findings  No free fluid.  IMPRESSION: 1. Normal exam. 2. Apparent  ovarian cyst suggests on CT is not visualized sonographically. The CT finding likely reflected a normal ovary with a normal involuting follicular cyst. This is unlikely to be the source of this patient's symptoms. No evidence of ovarian torsion.   Electronically Signed   By: Lajean Manes M.D.   On: 07/01/2015 07:47   I have personally reviewed and evaluated these images and lab results as part of my medical decision-making.   EKG Interpretation None      MDM   Final diagnoses:  Abdominal pain    I personally performed the services described in this documentation, which was scribed in my presence. The recorded information has been reviewed and is accurate.  Pt with sudden onset severe abd pain. Pain is diffuse, in the upper quadrants. She has hx of CIN and hx of csection. No focal tenderness on exam, and the pain was severe, so CT ordered. CT was neg for acute process - although there was a concern for a possible cyst. [place] ordered, and TOA and ovarian cyst r/o. Pt to be discharged with request for pcp f/u.  Varney Biles, MD 07/01/15 [phone]

## 2015-06-30 NOTE — ED Notes (Signed)
Pt. reports upper abdominal pain with nausea onset this afternoon denies emesis or diarrhea / no fever or chills.

## 2015-07-01 ENCOUNTER — Emergency Department (HOSPITAL_COMMUNITY): Payer: BC Managed Care – PPO

## 2015-07-01 MED ORDER — TRAMADOL HCL 50 MG PO TABS: 50.0000 mg | ORAL_TABLET | Freq: Four times a day (QID) | ORAL | Status: DC | PRN

## 2015-07-01 MED ORDER — IOHEXOL 300 MG/ML  SOLN
100.0000 mL | Freq: Once | INTRAMUSCULAR | Status: AC | PRN
  Administered 2015-07-01: 100 mL via INTRAVENOUS

## 2015-07-01 MED ORDER — HYDROMORPHONE HCL 1 MG/ML IJ SOLN
1.0000 mg | Freq: Once | INTRAMUSCULAR | Status: AC
  Administered 2015-07-01: 1 mg via INTRAVENOUS
  Filled 2015-07-01: qty 1

## 2015-07-01 MED ORDER — PROMETHAZINE HCL 25 MG PO TABS: 25.0000 mg | ORAL_TABLET | Freq: Four times a day (QID) | ORAL | Status: DC | PRN

## 2015-07-01 MED ORDER — IBUPROFEN 600 MG PO TABS: 600.0000 mg | ORAL_TABLET | Freq: Four times a day (QID) | ORAL | Status: DC | PRN

## 2015-07-01 NOTE — Discharge Instructions (Signed)
We saw you in the ER for the abdominal pain. All of our results are normal, including all labs and imaging. Kidney function is fine as well. We are not sure what is causing your abdominal pain, and recommend that you see your primary care doctor within 2-3 days for further evaluation. If your symptoms get worse, return to the ER. Take the pain meds and nausea meds as prescribed.  Please return to the ER if your symptoms worsen; you have increased pain, fevers, chills, inability to keep any medications down, confusion. Otherwise see the outpatient doctor as requested.   Abdominal Pain, Adult Many things can cause abdominal pain. Usually, abdominal pain is not caused by a disease and will improve without treatment. It can often be observed and treated at home. Your health care provider will do a physical exam and possibly order blood tests and X-rays to help determine the seriousness of your pain. However, in many cases, more time must pass before a clear cause of the pain can be found. Before that point, your health care provider may not know if you need more testing or further treatment. HOME CARE INSTRUCTIONS Monitor your abdominal pain for any changes. The following actions may help to alleviate any discomfort you are experiencing:  Only take over-the-counter or prescription medicines as directed by your health care provider.  Do not take laxatives unless directed to do so by your health care provider.  Try a clear liquid diet (broth, tea, or water) as directed by your health care provider. Slowly move to a bland diet as tolerated. SEEK MEDICAL CARE IF:  You have unexplained abdominal pain.  You have abdominal pain associated with nausea or diarrhea.  You have pain when you urinate or have a bowel movement.  You experience abdominal pain that wakes you in the night.  You have abdominal pain that is worsened or improved by eating food.  You have abdominal pain that is worsened with  eating fatty foods.  You have a fever. SEEK IMMEDIATE MEDICAL CARE IF:  Your pain does not go away within 2 hours.  You keep throwing up (vomiting).  Your pain is felt only in portions of the abdomen, such as the right side or the left lower portion of the abdomen.  You pass bloody or black tarry stools. MAKE SURE YOU:  Understand these instructions.  Will watch your condition.  Will get help right away if you are not doing well or get worse.   This information is not intended to replace advice given to you by your health care provider. Make sure you discuss any questions you have with your health care provider.   Document Released: 06/20/2005 Document Revised: 06/01/2015 Document Reviewed: 05/20/2013 Elsevier Interactive Patient Education [employer].

## 2015-07-01 NOTE — ED Notes (Signed)
Patient out of the room for testing.

## 2015-07-11 ENCOUNTER — Encounter (HOSPITAL_COMMUNITY): Payer: Self-pay | Admitting: *Deleted

## 2015-07-11 ENCOUNTER — Encounter (HOSPITAL_COMMUNITY): Payer: Self-pay | Admitting: Oncology

## 2015-07-11 ENCOUNTER — Emergency Department (HOSPITAL_COMMUNITY)
Admission: EM | Admit: 2015-07-11 | Discharge: 2015-07-11 | Disposition: A | Discharge status: Home / Self Care | Payer: BC Managed Care – PPO | Source: Home / Self Care | Attending: Emergency Medicine | Admitting: Emergency Medicine

## 2015-07-11 ENCOUNTER — Emergency Department (HOSPITAL_COMMUNITY)
Admission: EM | Admit: 2015-07-11 | Discharge: 2015-07-11 | Disposition: A | Discharge status: Home / Self Care | Payer: BC Managed Care – PPO | Attending: Emergency Medicine | Admitting: Emergency Medicine

## 2015-07-11 DIAGNOSIS — R6883 Chills (without fever): Secondary | ICD-10-CM | POA: Diagnosis not present

## 2015-07-11 DIAGNOSIS — Z8719 Personal history of other diseases of the digestive system: Secondary | ICD-10-CM | POA: Insufficient documentation

## 2015-07-11 DIAGNOSIS — R1084 Generalized abdominal pain: Secondary | ICD-10-CM | POA: Diagnosis present

## 2015-07-11 DIAGNOSIS — R197 Diarrhea, unspecified: Secondary | ICD-10-CM | POA: Insufficient documentation

## 2015-07-11 DIAGNOSIS — Z87410 Personal history of cervical dysplasia: Secondary | ICD-10-CM | POA: Diagnosis not present

## 2015-07-11 DIAGNOSIS — R109 Unspecified abdominal pain: Secondary | ICD-10-CM

## 2015-07-11 DIAGNOSIS — I10 Essential (primary) hypertension: Secondary | ICD-10-CM | POA: Insufficient documentation

## 2015-07-11 DIAGNOSIS — Z862 Personal history of diseases of the blood and blood-forming organs and certain disorders involving the immune mechanism: Secondary | ICD-10-CM | POA: Insufficient documentation

## 2015-07-11 DIAGNOSIS — Z86018 Personal history of other benign neoplasm: Secondary | ICD-10-CM | POA: Insufficient documentation

## 2015-07-11 DIAGNOSIS — R112 Nausea with vomiting, unspecified: Secondary | ICD-10-CM | POA: Insufficient documentation

## 2015-07-11 DIAGNOSIS — Z79899 Other long term (current) drug therapy: Secondary | ICD-10-CM | POA: Diagnosis not present

## 2015-07-11 DIAGNOSIS — Z8639 Personal history of other endocrine, nutritional and metabolic disease: Secondary | ICD-10-CM | POA: Insufficient documentation

## 2015-07-11 LAB — URINALYSIS, ROUTINE W REFLEX MICROSCOPIC
Bilirubin Urine: NEGATIVE
Bilirubin Urine: NEGATIVE
Glucose, UA: NEGATIVE mg/dL
Glucose, UA: NEGATIVE mg/dL
Ketones, ur: NEGATIVE mg/dL
Ketones, ur: NEGATIVE mg/dL
Nitrite: NEGATIVE
Nitrite: NEGATIVE
Protein, ur: NEGATIVE mg/dL
Protein, ur: 30 mg/dL — AB
Specific Gravity, Urine: 1.019 (ref 1.005–1.030)
Specific Gravity, Urine: 1.022 (ref 1.005–1.030)
Urobilinogen, UA: 0.2 mg/dL (ref 0.0–1.0)
Urobilinogen, UA: 0.2 mg/dL (ref 0.0–1.0)
pH: 5 (ref 5.0–8.0)
pH: 6.5 (ref 5.0–8.0)

## 2015-07-11 LAB — URINE MICROSCOPIC-ADD ON

## 2015-07-11 LAB — COMPREHENSIVE METABOLIC PANEL WITH GFR
ALT: 12 U/L — ABNORMAL LOW (ref 14–54)
AST: 23 U/L (ref 15–41)
Albumin: 4.6 g/dL (ref 3.5–5.0)
Alkaline Phosphatase: 45 U/L (ref 38–126)
Anion gap: 8 (ref 5–15)
BUN: 13 mg/dL (ref 6–20)
CO2: 25 mmol/L (ref 22–32)
Calcium: 9.3 mg/dL (ref 8.9–10.3)
Chloride: 103 mmol/L (ref 101–111)
Creatinine, Ser: 1.11 mg/dL — ABNORMAL HIGH (ref 0.44–1.00)
GFR calc Af Amer: >60 mL/min
GFR calc non Af Amer: 58 mL/min — ABNORMAL LOW
Glucose, Bld: 123 mg/dL — ABNORMAL HIGH (ref 65–99)
Potassium: 3.5 mmol/L (ref 3.5–5.1)
Sodium: 136 mmol/L (ref 135–145)
Total Bilirubin: 0.4 mg/dL (ref 0.3–1.2)
Total Protein: 7.9 g/dL (ref 6.5–8.1)

## 2015-07-11 LAB — COMPREHENSIVE METABOLIC PANEL
ALK PHOS: 48 U/L (ref 38–126)
ALT: 15 U/L (ref 14–54)
ANION GAP: 11 (ref 5–15)
AST: 22 U/L (ref 15–41)
Albumin: 4.8 g/dL (ref 3.5–5.0)
BILIRUBIN TOTAL: 0.6 mg/dL (ref 0.3–1.2)
BUN: 13 mg/dL (ref 6–20)
CALCIUM: 9.8 mg/dL (ref 8.9–10.3)
CO2: 25 mmol/L (ref 22–32)
CREATININE: 0.91 mg/dL (ref 0.44–1.00)
Chloride: 102 mmol/L (ref 101–111)
Glucose, Bld: 133 mg/dL — ABNORMAL HIGH (ref 65–99)
Potassium: 3.2 mmol/L — ABNORMAL LOW (ref 3.5–5.1)
SODIUM: 138 mmol/L (ref 135–145)
TOTAL PROTEIN: 8.1 g/dL (ref 6.5–8.1)

## 2015-07-11 LAB — CBC
HCT: 41 % (ref 36.0–46.0)
HCT: 42.6 % (ref 36.0–46.0)
Hemoglobin: 13.5 g/dL (ref 12.0–15.0)
Hemoglobin: 14.2 g/dL (ref 12.0–15.0)
MCH: 27.8 pg (ref 26.0–34.0)
MCH: 27.7 pg (ref 26.0–34.0)
MCHC: 32.9 g/dL (ref 30.0–36.0)
MCHC: 33.3 g/dL (ref 30.0–36.0)
MCV: 84.5 fL (ref 78.0–100.0)
MCV: 83 fL (ref 78.0–100.0)
Platelets: 303 K/uL (ref 150–400)
Platelets: 313 K/uL (ref 150–400)
RBC: 4.85 MIL/uL (ref 3.87–5.11)
RBC: 5.13 MIL/uL — ABNORMAL HIGH (ref 3.87–5.11)
RDW: 13.3 % (ref 11.5–15.5)
RDW: 13 % (ref 11.5–15.5)
WBC: 4.1 K/uL (ref 4.0–10.5)
WBC: 6.1 K/uL (ref 4.0–10.5)

## 2015-07-11 LAB — LIPASE, BLOOD: Lipase: 28 U/L (ref 22–51)

## 2015-07-11 MED ORDER — KETOROLAC TROMETHAMINE 30 MG/ML IJ SOLN: 30.0000 mg | Freq: Once | INTRAMUSCULAR | Status: DC

## 2015-07-11 MED ORDER — OXYCODONE-ACETAMINOPHEN 5-325 MG PO TABS: 2.0000 | ORAL_TABLET | ORAL | Status: DC | PRN

## 2015-07-11 MED ORDER — ONDANSETRON 8 MG PO TBDP
8.0000 mg | ORAL_TABLET | Freq: Once | ORAL | Status: AC
Start: 2015-07-11 — End: 2015-07-11
  Administered 2015-07-11: 8 mg via ORAL
  Filled 2015-07-11: qty 1

## 2015-07-11 MED ORDER — OXYCODONE-ACETAMINOPHEN 5-325 MG PO TABS
1.0000 | ORAL_TABLET | Freq: Once | ORAL | Status: AC
  Administered 2015-07-11: 1 via ORAL
  Filled 2015-07-11: qty 1

## 2015-07-11 MED ORDER — HYDROMORPHONE HCL 1 MG/ML IJ SOLN
1.0000 mg | Freq: Once | INTRAMUSCULAR | Status: AC
  Administered 2015-07-11: 1 mg via INTRAVENOUS
  Filled 2015-07-11: qty 1

## 2015-07-11 MED ORDER — POTASSIUM CHLORIDE CRYS ER 20 MEQ PO TBCR
40.0000 meq | EXTENDED_RELEASE_TABLET | Freq: Once | ORAL | Status: DC
  Filled 2015-07-11: qty 2

## 2015-07-11 MED ORDER — KETOROLAC TROMETHAMINE 30 MG/ML IJ SOLN
30.0000 mg | Freq: Once | INTRAMUSCULAR | Status: AC
  Administered 2015-07-11: 30 mg via INTRAVENOUS
  Filled 2015-07-11: qty 1

## 2015-07-11 MED ORDER — HYDROCODONE-ACETAMINOPHEN 5-325 MG PO TABS
1.0000 | ORAL_TABLET | Freq: Once | ORAL | Status: AC
  Administered 2015-07-11: 1 via ORAL
  Filled 2015-07-11: qty 1

## 2015-07-11 NOTE — ED Provider Notes (Signed)
CSN: [id number]     Arrival date & time 07/11/15  1441 History   First MD Initiated Contact with Patient 07/11/15 1715     Chief Complaint  Patient presents with  . Abdominal Pain     HPI   [name] is a 49 y.o. female with a PMH of cervical intraepithelial neoplasia, fibroid, umbilical hernia, HTN, HLD who presents to the ED with abdominal pain, which she states started around 2 AM this morning. She reports her pain is intermittent. She denies precipitating factors. She states nothing improves her pain. She reports associated chills, nausea, vomiting, and diarrhea. She denies hematochezia or melena. She denies HA, lightheadedness, dizziness, chest pain, shortness of breath. She was evaluated in the ED earlier today, and was subsequently discharged. She had an appointment with Dr. Rosendo Gros, however was told everything was normal and that she could return to the ED for symptom recurrence.   Past Medical History  Diagnosis Date  . CIN I (cervical intraepithelial neoplasia I)   . Fibroid   . Elevated cholesterol   . Hypertension   . Postpartum care following cesarean delivery (8/2) 04/29/2012  . Ileus, postoperative 04/29/2012  . Thrombocytopenia ([place]) 04/29/2012  . Umbilical hernia    Past Surgical History  Procedure Laterality Date  . Cesarean section      X3  . Colposcopy    . Myomectomy  04/25/2012    Procedure: MYOMECTOMY;  Surgeon: Marvene Staff, MD;  Location: [facility];  Service: Gynecology;  Laterality: N/A;  . Cystoscopy  04/25/2012    Procedure: CYSTOSCOPY;  Surgeon: Marvene Staff, MD;  Location: [facility];  Service: Gynecology;  Laterality: N/A;   Family History  Problem Relation Age of Onset  . Hypertension Mother   . Hypertension Father   . Stroke Father   . Diabetes Maternal Grandmother   . Heart disease Paternal Grandfather    Social History  Substance Use Topics  . Smoking status: Never Smoker   . Smokeless tobacco: None  . Alcohol Use: No   OB  History    Gravida Para Term Preterm AB TAB SAB Ectopic Multiple Living   6 3 2 1 3 1 2   3       Review of Systems  Constitutional: Positive for chills. Negative for fever.  Respiratory: Negative for shortness of breath.   Cardiovascular: Negative for chest pain.  Gastrointestinal: Positive for nausea, vomiting, abdominal pain and diarrhea. Negative for constipation and blood in stool.  Genitourinary: Negative for dysuria, urgency and frequency.  Skin: Negative for wound.  Neurological: Negative for dizziness, syncope, weakness, light-headedness, numbness and headaches.  All other systems reviewed and are negative.     Allergies  Review of patient's allergies indicates no known allergies.  Home Medications   Prior to Admission medications   Medication Sig Start Date End Date Taking? Authorizing Provider  amLODipine (NORVASC) 10 MG tablet Take 10 mg by mouth daily.   Yes Historical Provider, MD  ciprofloxacin (CIPRO) 500 MG tablet Take 500 mg by mouth every 12 (twelve) hours. 07/04/15  Yes Historical Provider, MD  ibuprofen (ADVIL,MOTRIN) 600 MG tablet Take 1 tablet (600 mg total) by mouth every 6 (six) hours as needed. Patient taking differently: Take 600 mg by mouth every 6 (six) hours as needed for moderate pain.  07/01/15  Yes Ankit Nanavati, MD  LORazepam (ATIVAN) 1 MG tablet Take 0.5 mg by mouth every 8 (eight) hours.   Yes Historical Provider, MD  metroNIDAZOLE (FLAGYL)  500 MG tablet Take 500 mg by mouth every 8 (eight) hours. 07/04/15  Yes Historical Provider, MD  valsartan-hydrochlorothiazide (DIOVAN-HCT) 80-12.5 MG tablet Take 1 tablet by mouth daily. 06/01/15  Yes Historical Provider, MD  promethazine (PHENERGAN) 25 MG tablet Take 1 tablet (25 mg total) by mouth every 6 (six) hours as needed for nausea. 07/01/15   Varney Biles, MD  traMADol (ULTRAM) 50 MG tablet Take 1 tablet (50 mg total) by mouth every 6 (six) hours as needed. Patient taking differently: Take 50 mg by  mouth every 6 (six) hours as needed for moderate pain.  07/01/15   Ankit Nanavati, MD    BP 141/89 mmHg  Pulse 56  Temp(Src) 98.4 F (36.9 C) (Oral)  Resp 20  SpO2 100%  LMP 07/08/2015 Physical Exam  Constitutional: She is oriented to person, place, and time. She appears well-developed and well-nourished. She appears distressed.  Patient in distress due to pain.  HENT:  Head: Normocephalic and atraumatic.  Right Ear: External ear normal.  Left Ear: External ear normal.  Nose: Nose normal.  Mouth/Throat: Uvula is midline, oropharynx is clear and moist and mucous membranes are normal.  Eyes: Conjunctivae, EOM and lids are normal. Pupils are equal, round, and reactive to light. Right eye exhibits no discharge. Left eye exhibits no discharge. No scleral icterus.  Neck: Normal range of motion. Neck supple.  Cardiovascular: Normal rate, regular rhythm, normal heart sounds, intact distal pulses and normal pulses.   Pulmonary/Chest: Effort normal and breath sounds normal. No respiratory distress.  Abdominal: Soft. Normal appearance and bowel sounds are normal. She exhibits no distension and no mass. There is tenderness. There is no rigidity, no rebound and no guarding.  Diffuse TTP of abdomen. No rebound, guarding, or palpable masses.  Musculoskeletal: Normal range of motion. She exhibits no edema or tenderness.  Neurological: She is alert and oriented to person, place, and time.  Skin: Skin is warm, dry and intact. No rash noted. She is not diaphoretic. No erythema. No pallor.  Psychiatric: She has a normal mood and affect. Her speech is normal and behavior is normal.  Nursing note and vitals reviewed.   ED Course  Procedures (including critical care time)  Labs Review Labs Reviewed  COMPREHENSIVE METABOLIC PANEL - Abnormal; Notable for the following:    Potassium 3.2 (*)    Glucose, Bld 133 (*)    All other components within normal limits  CBC - Abnormal; Notable for the following:     RBC 5.13 (*)    All other components within normal limits  URINALYSIS, ROUTINE W REFLEX MICROSCOPIC (NOT AT [facility]) - Abnormal; Notable for the following:    Color, Urine RED (*)    APPearance TURBID (*)    Hgb urine dipstick LARGE (*)    Protein, ur 30 (*)    Leukocytes, UA SMALL (*)    All other components within normal limits  URINE MICROSCOPIC-ADD ON - Abnormal; Notable for the following:    Squamous Epithelial / LPF MANY (*)    Bacteria, UA MANY (*)    All other components within normal limits  URINE CULTURE    Imaging Review No results found.   I have personally reviewed and evaluated these images and lab results as part of my medical decision-making.   EKG Interpretation None      MDM   Final diagnoses:  Abdominal pain, unspecified abdominal location    49 year old female presents with abdominal pain. She was evaluated earlier  today for the same symptoms, and was subsequently discharged. She reports her symptoms are unchanged from the abdominal pain she has experienced in the past.  Patient is afebrile. Vital signs stable. She appears to be in distress due to pain. Heart RRR. Lungs clear to auscultation bilaterally. Abdomen soft, with diffuse TTP, worse in RUQ and RLQ. No rebound, guarding, or palpable masses.  Pain controlled in the ED.  CBC negative for leukocytosis or anemia. CMP with potassium 3.2, repleted in the ED. UA with large hemoglobin, small leukocytes, many squamous epithelial cells, 3-6 WBC, TNTC RBC, many bacteria. Likely due to contamination (patient just finished menstrual cycle yesterday). No dysuria, urgency, frequency. Do not feel treatment for UTI is indicated at this time. Urine culture ordered.  Patient reports symptom resolution with pain medication. Recent CT abdomen pelvis 10/07 revealed nonspecific nonobstructive mural enhancement of distal small bowel.  Do not feel further evaluation with imaging is indicated at this time, given the patient  has no new symptoms, no peritoneal signs, no significant lab abnormality on CBC and BMP, and has had symptom relief with 1 dose of pain medication. Patient discussed with and seen by Dr. Regenia Skeeter. Spoke with patient at length regarding plan. She and family members are requesting admission for observation; advised patient she does not meet admission criteria.  Patient able to tolerate PO trial. Feel she is stable stable for discharge at this time with pain medication and close follow-up. Patient to follow up with PCP and gastroenterologist. Return precautions discussed at length.  BP 136/95 mmHg  Pulse 71  Temp(Src) 98.4 F (36.9 C) (Oral)  Resp 16  SpO2 100%  LMP 07/08/2015   Marella Chimes, PA-C 07/12/15 [place], MD 07/12/15 [phone]

## 2015-07-11 NOTE — ED Notes (Signed)
Pt reports she was seen here earlier this am for hernia pain.  States she needs "pre-op" and needs surgery.  States she was sent here by Dr. Rosendo Gros.  Pt is moaning and yelling saying "my hernia" and "i need pain meds."  Pt would not hold on the emesis bag.  States she is unable.  Pt is moving her arms and hands without difficulty at this time.

## 2015-07-11 NOTE — Discharge Instructions (Signed)
Abdominal Pain, Adult Many things can cause belly (abdominal) pain. Most times, the belly pain is not dangerous. Many cases of belly pain can be watched and treated at home. HOME CARE   Do not take medicines that help you go poop (laxatives) unless told to by your doctor.  Only take medicine as told by your doctor.  Eat or drink as told by your doctor. Your doctor will tell you if you should be on a special diet. GET HELP IF:  You do not know what is causing your belly pain.  You have belly pain while you are sick to your stomach (nauseous) or have runny poop (diarrhea).  You have pain while you pee or poop.  Your belly pain wakes you up at night.  You have belly pain that gets worse or better when you eat.  You have belly pain that gets worse when you eat fatty foods.  You have a fever. GET HELP RIGHT AWAY IF:   The pain does not go away within 2 hours.  You keep throwing up (vomiting).  The pain changes and is only in the right or left part of the belly.  You have bloody or tarry looking poop. MAKE SURE YOU:   Understand these instructions.  Will watch your condition.  Will get help right away if you are not doing well or get worse.   This information is not intended to replace advice given to you by your health care provider. Make sure you discuss any questions you have with your health care provider.   Document Released: 02/27/2008 Document Revised: 10/01/2014 Document Reviewed: 05/20/2013 Elsevier Interactive Patient Education [employer]. Please go to your appointment as scheduled Please inform them you had blood work done today

## 2015-07-11 NOTE — Discharge Instructions (Signed)
1. Medications: percocet, usual home medications 2. Treatment: rest, drink plenty of fluids 3. Follow Up: please followup with your primary doctor and your gastroenterologist for discussion of your diagnoses and further evaluation after today's visit; if you do not have a primary care doctor use the resource guide provided to find one; please return to the ER for new or worsening symptoms   Abdominal Pain, Adult Many things can cause abdominal pain. Usually, abdominal pain is not caused by a disease and will improve without treatment. It can often be observed and treated at home. Your health care provider will do a physical exam and possibly order blood tests and X-rays to help determine the seriousness of your pain. However, in many cases, more time must pass before a clear cause of the pain can be found. Before that point, your health care provider may not know if you need more testing or further treatment. HOME CARE INSTRUCTIONS Monitor your abdominal pain for any changes. The following actions may help to alleviate any discomfort you are experiencing:  Only take over-the-counter or prescription medicines as directed by your health care provider.  Do not take laxatives unless directed to do so by your health care provider.  Try a clear liquid diet (broth, tea, or water) as directed by your health care provider. Slowly move to a bland diet as tolerated. SEEK MEDICAL CARE IF:  You have unexplained abdominal pain.  You have abdominal pain associated with nausea or diarrhea.  You have pain when you urinate or have a bowel movement.  You experience abdominal pain that wakes you in the night.  You have abdominal pain that is worsened or improved by eating food.  You have abdominal pain that is worsened with eating fatty foods.  You have a fever. SEEK IMMEDIATE MEDICAL CARE IF:  Your pain does not go away within 2 hours.  You keep throwing up (vomiting).  Your pain is felt only in  portions of the abdomen, such as the right side or the left lower portion of the abdomen.  You pass bloody or black tarry stools. MAKE SURE YOU:  Understand these instructions.  Will watch your condition.  Will get help right away if you are not doing well or get worse.   This information is not intended to replace advice given to you by your health care provider. Make sure you discuss any questions you have with your health care provider.   Document Released: 06/20/2005 Document Revised: 06/01/2015 Document Reviewed: 05/20/2013 Elsevier Interactive Patient Education 2016 [employer].   Emergency Department Resource Guide 1) Find a Doctor and Pay Out of Pocket Although you won't have to find out who is covered by your insurance plan, it is a good idea to ask around and get recommendations. You will then need to call the office and see if the doctor you have chosen will accept you as a new patient and what types of options they offer for patients who are self-pay. Some doctors offer discounts or will set up payment plans for their patients who do not have insurance, but you will need to ask so you aren't surprised when you get to your appointment.  2) Contact Your Local Health Department Not all health departments have doctors that can see patients for sick visits, but many do, so it is worth a call to see if yours does. If you don't know where your local health department is, you can check in your phone book. The CDC also has  a tool to help you locate your state's health department, and many state websites also have listings of all of their local health departments.  3) Find a [facility] If your illness is not likely to be very severe or complicated, you may want to try a walk in clinic. These are popping up all over the country in pharmacies, drugstores, and shopping centers. They're usually staffed by nurse practitioners or physician assistants that have been trained to treat common  illnesses and complaints. They're usually fairly quick and inexpensive. However, if you have serious medical issues or chronic medical problems, these are probably not your best option.  No Primary Care Doctor: - Call Health Connect at  [phone] - they can help you locate a primary care doctor that  accepts your insurance, provides certain services, etc. - Physician Referral Service- [phone]  Chronic Pain Problems: Organization         Address  Phone   Notes  [facility]  [phone] Patients need to be referred by their primary care doctor.   Medication Assistance: Organization         Address  Phone   Notes  [facility] Medication [facility] [place], [place] [address] [phone] --Must be a resident of [facility] -- Must have NO insurance coverage whatsoever (no Medicaid/ Medicare, etc.) -- The pt. MUST have a primary care doctor that directs their care regularly and follows them in the community   [facility]ist  [phone]   [employer]  [phone]    Agencies that provide inexpensive medical care: Organization         Address  Phone   Notes  [place]  [phone]   [facility]    [phone]   [facility] [place], [place] [address] [phone]   [place] [address] [place] [phone]   Planned Parenthood    [phone]   [facility]    [phone]   [place] and [address], [place] Phone:  [phone], Fax:  [phone] Hours of Operation:  9 am - 6 pm, M-F.  Also accepts Medicaid/Medicare and self-pay.  [facility] for [place], [address], [place] Phone: [phone], Fax: [phone]. Hours of Operation:  8:30 am - 5:30 pm, M-F.  Also accepts Medicaid and self-pay.  [facility] [place]  [address] [place] Phone: [phone]   [place], [place], [place] [phone], Ext. [phone] Mondays & Thursdays: 7-9 AM.  First 15 patients are seen on a first come, first serve basis.    [place] Providers:  Organization         Address  Phone   Notes  [facility] [address], [place] [phone] Also accepts self-pay patients.  [facility] [address], [place]  [phone]   [place], [address], [place] [phone]   [facility] [address], [place] [phone]   Lucianne Lei [address], [place]   [phone] Only accepts [place] Access [place] patients after they have their name applied to their card.   Self-Pay (no insurance) in [place]:  Organization         Address  Phone   Notes  Sickle Cell Patients, [facility] [place] [phone]   [facility] [place] [phone]   [facility] [place]  [place], [address], [place] [phone]   [facility]/Dr. Osei-Bonsu  [address], [place] or [address], [place] [phone] Phone number for both [place] and [place] locations is the same.  Urgent Medical and [facility] [address] [place] [phone]   [facility] [address] [place] or [address] [phone]   [facility] [address] [place] [phone], phone; [phone], fax Sees patients 1st and 3rd Saturday of every month.  Must not qualify for public or private insurance (i.e. Medicaid, Medicare, [place] Health Choice, Veterans' Benefits)  Household income should be no more than 200% of the  poverty level The clinic cannot treat you if you are pregnant or think you are pregnant  Sexually transmitted diseases are not treated at the clinic.    Dental Care: Organization         Address  Phone  Notes  [facility] Department of [facility] [place] [phone] Accepts children up to age 68 who are enrolled in [place] or [place]; pregnant women with a Medicaid card; and children who have applied for Medicaid or [place] Health Choice, but were declined, whose parents can pay a reduced fee at time of service.  [facility] Department of [facility]  [address], [place] [phone] Accepts children up to age 76 who are enrolled in [place] or [place]; pregnant women with a Medicaid card; and children who have applied for Medicaid or [place] Health Choice, but were declined, whose parents can pay a reduced fee at time of service.  [place] Adult [facility] Access PROGRAM  [place] [phone] Patients are seen by appointment only. Walk-ins are not accepted. [place] will see patients 47 years of age and older. Monday - Tuesday (8am-5pm) Most Wednesdays (8:30-5pm) $30 per visit, cash only  [facility] Adult Dental Access PROGRAM  [address], [facility] [phone] Patients are seen by appointment only. Walk-ins are not accepted. [place] will see patients 68 years of age and older. One Wednesday Evening (Monthly: Volunteer Based).  $30 per visit, cash only  [place]  [phone] for adults; Children under age 59, call Graduate Pediatric Dentistry at [phone]. Children aged 53-14, please call [phone] to request a pediatric application.  Dental services are provided in all areas of dental care including fillings, crowns and bridges, complete and partial dentures, implants, gum treatment, root canals, and extractions.  Preventive care is also provided. Treatment is provided to both adults and children. Patients are selected via a lottery and there is often a waiting list.   [facility] [address] [place]  [phone] [email or web address]   [facility] [address] [place], [place] [phone], Ext. [phone] Second and Fourth Thursday of each month, opens at 6:30 AM; Clinic ends at 9 AM.  Patients are seen on a first-come first-served basis, and a limited number are seen during each clinic.  [facility]  [address] Hillard Danker [place], [place] [phone]   Eligibility Requirements You must have lived in [place], [place], or [place] counties for at least the last three months.   You cannot be eligible for state or federal sponsored [employer], including [employer], [place], or [employer].   You generally cannot be eligible for healthcare insurance through your employer.    How to apply: Eligibility screenings are held every Tuesday and Wednesday afternoon from 1:00 pm until 4:00 pm. You do not need an appointment for the interview!  [facility] [address] [place], [place]   [place]  [place] Department  [place]  [phone]    Behavioral Health Resources in the Community: Intensive Outpatient Programs Organization         Address  Phone  Notes  [place]. [address] [place], [place] [phone]   [facility] [address] [place], [place]   ADS: Alcohol & Drug Svcs [address] [place], [place]   [place] [address]  [place], [place] or [phone]   Substance Abuse Resources Organization         Address  Phone  Notes  Alcohol and Drug Services  [phone]   [place]  [phone]   [place]   Chinita Pester  [phone]   Residential & Outpatient Substance Abuse Program  [phone]   Psychological Services Organization         Address  Phone  Notes  [facility]  [place]  [phone]   [place] [address], [place] or [phone]    Mobile Crisis Teams Organization         Address  Phone  Notes  [facility], Mobile Crisis Care Unit  [phone]   [facility]  [address] [place], [place]   [place] [address] [place] [phone]    Self-Help/Support Groups Organization         Address  Phone             Notes  [place]. of [place] - variety of support groups  [place] Call for more information  Narcotics Anonymous (NA), [facility] [address], [employer] [place]  2 meetings at this location   Special educational needs teacher         Address  Phone  Notes  [facility]idential Treatment [place],    [place]  [phone]   [facility]  [address] [place] [address], [place], [place]   [place], [place] [phone] Admissions: 8am-3pm M-F  Incentives Substance [place] [address]    [place], [place] [phone]   [facility] [address] Jadene Pierini [place], [place]   [facility] [address],  [place], [place]   Insight Programs - Intensive Outpatient [address] Kristeen [address], [place], [place]   [facility] ([place] [place].,  [place], [place] or [phone]   [facility]idential Treatment Services (RTS) [address] [place], [place] Accepts Medicaid  [facility] [place] [address]  [place]  [phone] Substance Abuse/Addiction  [place] Resources Organization         Address  Phone  Notes  [facility]  [phone]   Domenic Schwab, PhD [address] Arlis Porta [place], [place]   [phone] or [phone]   [place], [place] [phone]   [address], [place], [place] [phone] Insurance/Medicaid/sponsorship through [facility] and Families [address], [address] [place]                                    [place], [place] [phone] [place] [address], [place] [phone]    Dr. Adele Schilder  [phone]   [facility] Dept. 1) [address], [place] 2) [place] 3)  [address], [place] [phone]  [phone]   [facility] [phone] or [phone] (After Hours)

## 2015-07-11 NOTE — ED Notes (Signed)
Attempted IV access x2, both unsuccessful. 

## 2015-07-11 NOTE — ED Notes (Signed)
Pt is thrashing in pain and screaming/moaning/crying about 10/10 abd pain.  She reports hx of strangulated umbilical hernia and is scheduled for "pre-op" today with GI.

## 2015-07-11 NOTE — ED Provider Notes (Signed)
CSN: [id number]     Arrival date & time 07/11/15  0316 History   First MD Initiated Contact with Patient 07/11/15 0354     Chief Complaint  Patient presents with  . Abdominal Pain     (Consider location/radiation/quality/duration/timing/severity/associated sxs/prior Treatment) HPI Comments: Patient  chronci abdominal pain states is scheduled for pre op labs for " hernia repair" but unsure which hernia is being repaired Woke at 2 AM with pain was given Ibuprofen than 1 hour later vomited X 1.   Patient is a 49 y.o. female presenting with abdominal pain. The history is provided by the patient and the spouse.  Abdominal Pain Pain location:  Generalized Pain quality: cramping   Pain severity:  Severe Onset quality:  Sudden Duration:  2 hours Timing:  Constant Progression:  Unchanged Chronicity:  Chronic Context: not alcohol use, not awakening from sleep, not diet changes, not eating, not laxative use and not trauma   Relieved by:  Nothing Worsened by:  Movement and palpation Ineffective treatments:  NSAIDs Associated symptoms: vomiting   Associated symptoms: no constipation, no cough, no diarrhea, no dysuria, no fever, no flatus, no nausea and no shortness of breath     Past Medical History  Diagnosis Date  . CIN I (cervical intraepithelial neoplasia I)   . Fibroid   . Elevated cholesterol   . Hypertension   . Postpartum care following cesarean delivery (8/2) 04/29/2012  . Ileus, postoperative 04/29/2012  . Thrombocytopenia ([place]) 04/29/2012  . Umbilical hernia    Past Surgical History  Procedure Laterality Date  . Cesarean section      X3  . Colposcopy    . Myomectomy  04/25/2012    Procedure: MYOMECTOMY;  Surgeon: Marvene Staff, MD;  Location: [facility];  Service: Gynecology;  Laterality: N/A;  . Cystoscopy  04/25/2012    Procedure: CYSTOSCOPY;  Surgeon: Marvene Staff, MD;  Location: [facility];  Service: Gynecology;  Laterality: N/A;   Family History  Problem Relation  Age of Onset  . Hypertension Mother   . Hypertension Father   . Stroke Father   . Diabetes Maternal Grandmother   . Heart disease Paternal Grandfather    Social History  Substance Use Topics  . Smoking status: Never Smoker   . Smokeless tobacco: None  . Alcohol Use: No   OB History    Gravida Para Term Preterm AB TAB SAB Ectopic Multiple Living   6 3 2 1 3 1 2   3      Review of Systems  Constitutional: Negative for fever.  Respiratory: Negative for cough and shortness of breath.   Gastrointestinal: Positive for vomiting and abdominal pain. Negative for nausea, diarrhea, constipation and flatus.  Genitourinary: Negative for dysuria.  All other systems reviewed and are negative.     Allergies  Review of patient's allergies indicates no known allergies.  Home Medications   Prior to Admission medications   Medication Sig Start Date End Date Taking? Authorizing Provider  amLODipine (NORVASC) 10 MG tablet Take 10 mg by mouth daily.   Yes Historical Provider, MD  promethazine (PHENERGAN) 25 MG tablet Take 1 tablet (25 mg total) by mouth every 6 (six) hours as needed for nausea. 07/01/15  Yes Varney Biles, MD  traMADol (ULTRAM) 50 MG tablet Take 1 tablet (50 mg total) by mouth every 6 (six) hours as needed. Patient taking differently: Take 50 mg by mouth every 6 (six) hours as needed for moderate pain.  07/01/15  Yes Ankit  Kathrynn Humble, MD  valsartan-hydrochlorothiazide (DIOVAN-HCT) 80-12.5 MG tablet Take 1 tablet by mouth daily. 06/01/15  Yes Historical Provider, MD  ibuprofen (ADVIL,MOTRIN) 600 MG tablet Take 1 tablet (600 mg total) by mouth every 6 (six) hours as needed. Patient taking differently: Take 600 mg by mouth every 6 (six) hours as needed for moderate pain.  07/01/15   Ankit Kathrynn Humble, MD   BP 126/78 mmHg  Pulse 65  Temp(Src) 98.1 F (36.7 C) (Oral)  Resp 20  Ht 5\' 1"  (1.549 m)  Wt 145 lb (65.772 kg)  BMI 27.41 kg/m2  SpO2 100%  LMP 07/08/2015 Physical Exam   Constitutional: She appears well-developed.  HENT:  Head: Normocephalic.  Eyes: Pupils are equal, round, and reactive to light.  Neck: Normal range of motion.  Cardiovascular: Normal rate.   Pulmonary/Chest: Effort normal.  Abdominal: Soft. She exhibits no distension. There is tenderness.  Musculoskeletal: Normal range of motion.  Neurological: She is alert.  Skin: Skin is warm.    ED Course  Procedures (including critical care time) Labs Review Labs Reviewed  COMPREHENSIVE METABOLIC PANEL - Abnormal; Notable for the following:    Glucose, Bld 123 (*)    Creatinine, Ser 1.11 (*)    ALT 12 (*)    GFR calc non Af Amer 58 (*)    All other components within normal limits  URINALYSIS, ROUTINE W REFLEX MICROSCOPIC (NOT AT [facility]) - Abnormal; Notable for the following:    Color, Urine AMBER (*)    APPearance CLOUDY (*)    Hgb urine dipstick TRACE (*)    Leukocytes, UA SMALL (*)    All other components within normal limits  URINE MICROSCOPIC-ADD ON - Abnormal; Notable for the following:    Squamous Epithelial / LPF FEW (*)    Casts HYALINE CASTS (*)    All other components within normal limits  LIPASE, BLOOD  CBC    Imaging Review No results found. I have personally reviewed and evaluated these images and lab results as part of my medical decision-making.   EKG Interpretation None    Patient more comfortable after IM Toradol now states has appointment with Dr. Benson [place] so hernia to be repaired most likely the Hiatal hernia.   MDM   Final diagnoses:  Generalized abdominal pain         Junius Creamer, NP 07/11/15 0513  April Palumbo, MD 07/11/15 1601

## 2015-07-11 NOTE — ED Notes (Signed)
Pt moaning and screaming out in pain.

## 2015-07-11 NOTE — ED Notes (Signed)
Pt alert,oriented, and ambulatory upon DC. She was advised to follow up with PCP and GI.

## 2015-07-11 NOTE — ED Notes (Signed)
Pt presents d/t RUQ abdominal pain.  Pt is writhing in wheelchair and very vocal about the pain.  +nausea, +emesis.

## 2015-07-11 NOTE — ED Notes (Signed)
Offered ODT Zofran and Percocet to pt while waiting for MD to evaluate and order further treatment.

## 2015-07-13 DIAGNOSIS — E7800 Pure hypercholesterolemia, unspecified: Secondary | ICD-10-CM | POA: Insufficient documentation

## 2015-07-13 LAB — URINE CULTURE

## 2015-07-14 ENCOUNTER — Telehealth (HOSPITAL_BASED_OUTPATIENT_CLINIC_OR_DEPARTMENT_OTHER): Payer: Self-pay | Admitting: Emergency Medicine

## 2015-07-14 NOTE — Progress Notes (Signed)
ED Antimicrobial Stewardship Positive Culture Follow Up   [name] is an 49 y.o. female who presented to [facility] on 07/11/2015 with a chief complaint of  Chief Complaint  Patient presents with  . Abdominal Pain    Recent Results (from the past 720 hour(s))  Urine culture     Status: None   Collection Time: 07/11/15  7:30 PM  Result Value Ref Range Status   Specimen Description URINE, CLEAN CATCH  Final   Special Requests NONE  Final   Culture   Final    20,000 COLONIES/mL YEAST Performed at [facility]    Report Status 07/13/2015 FINAL  Final    NO FURTHER TREATMENT INDICATED  ED Provider: Delos Haring, PA-C  49 y/o F w/ abdominal pain. No dysuria, urgency, or frequency. CT abdomen showed distal small bowel. Discharged on po abx for this. Urine cx grew 20,000 colonies of yeast from UA with MANY squamous and is likely contaminated. No treatment indicated at this time.   Angela Burke, PharmD Pharmacy Resident Pager: [phone] 07/14/2015, 9:16 AM Infectious Diseases Pharmacist Phone# [phone]

## 2015-07-14 NOTE — Telephone Encounter (Signed)
Post ED Visit - Positive Culture Follow-up  Culture report reviewed by antimicrobial stewardship pharmacist:  []  Heide Guile, Pharm.D., BCPS []  Alycia Rossetti, Pharm.D., BCPS []  [place], Pharm.D., BCPS, AAHIVP []  Legrand Como, Pharm.D., BCPS, AAHIVP []  [place], Pharm.D. []  Milus Glazier, Pharm.D. Angela Burke PharmD  Positive urine culture <20,000 colonies yeast  Treated with none, asymptomatic and no further patient follow-up is required at this time.  Hazle Nordmann 07/14/2015, 10:01 AM

## 2016-12-13 ENCOUNTER — Ambulatory Visit: Payer: Self-pay

## 2016-12-13 ENCOUNTER — Other Ambulatory Visit: Payer: Self-pay | Admitting: Occupational Medicine

## 2016-12-13 DIAGNOSIS — M79644 Pain in right finger(s): Secondary | ICD-10-CM

## 2017-01-27 ENCOUNTER — Emergency Department (HOSPITAL_COMMUNITY)
Admission: EM | Admit: 2017-01-27 | Discharge: 2017-01-28 | Disposition: A | Discharge status: Home / Self Care | Payer: BC Managed Care – PPO | Attending: Emergency Medicine | Admitting: Emergency Medicine

## 2017-01-27 ENCOUNTER — Encounter (HOSPITAL_COMMUNITY): Payer: Self-pay | Admitting: Emergency Medicine

## 2017-01-27 ENCOUNTER — Emergency Department (HOSPITAL_COMMUNITY): Payer: BC Managed Care – PPO

## 2017-01-27 DIAGNOSIS — R509 Fever, unspecified: Secondary | ICD-10-CM | POA: Diagnosis not present

## 2017-01-27 DIAGNOSIS — R42 Dizziness and giddiness: Secondary | ICD-10-CM | POA: Insufficient documentation

## 2017-01-27 DIAGNOSIS — H578 Other specified disorders of eye and adnexa: Secondary | ICD-10-CM | POA: Diagnosis not present

## 2017-01-27 DIAGNOSIS — M545 Low back pain: Secondary | ICD-10-CM | POA: Diagnosis not present

## 2017-01-27 DIAGNOSIS — J029 Acute pharyngitis, unspecified: Secondary | ICD-10-CM | POA: Insufficient documentation

## 2017-01-27 DIAGNOSIS — R05 Cough: Secondary | ICD-10-CM | POA: Diagnosis present

## 2017-01-27 DIAGNOSIS — I10 Essential (primary) hypertension: Secondary | ICD-10-CM | POA: Diagnosis not present

## 2017-01-27 DIAGNOSIS — R51 Headache: Secondary | ICD-10-CM | POA: Insufficient documentation

## 2017-01-27 DIAGNOSIS — Z79899 Other long term (current) drug therapy: Secondary | ICD-10-CM | POA: Diagnosis not present

## 2017-01-27 DIAGNOSIS — R6889 Other general symptoms and signs: Secondary | ICD-10-CM

## 2017-01-27 MED ORDER — ACETAMINOPHEN 325 MG PO TABS
650.0000 mg | ORAL_TABLET | Freq: Once | ORAL | Status: AC | PRN
  Administered 2017-01-27: 650 mg via ORAL
  Filled 2017-01-27: qty 2

## 2017-01-27 NOTE — ED Notes (Signed)
Pt has a 102.4 fever, c/o body aches.

## 2017-01-27 NOTE — ED Triage Notes (Addendum)
Pt comes in with congestion and flu like symptoms that started on Friday.  Pt febrile on assessment.  Has been taking claritin, theraflu, and benadryl. Voice hoarse.  Endorses productive cough with green sputum. Denies N&V.

## 2017-01-27 NOTE — ED Provider Notes (Signed)
[facility] DEPT Provider Note   CSN: [id number] Arrival date & time: 01/27/17  2025  By signing my name below, I, Higinio Plan, attest that this documentation has been prepared under the direction and in the presence of non-physician practitioner, Emeline General, PA-C. Electronically Signed: Higinio Plan, Scribe. 01/27/2017. 10:15 PM.  History   Chief Complaint Chief Complaint  Patient presents with  . Flu Like Symptoms   The history is provided by the patient. No language interpreter was used.   HPI Comments: [name] is a 51 y.o. female who presents to the Emergency Department complaining of gradually worsening, cough productive of green sputum and sore throat that began 3 days ago. Pt reports associated hoarse voice with "throat burning," watery drainage from her bilateral eyes, fever (TMAX 102.9 temporally in the ED), chills, generalized headache, dizziness, lightheadedness, and sharp pain in her lower back. She states she initially believed her symptoms were attributed to seasonal allergies in which she took Benadryl and Claritin with no relief. She notes she also took Theraflu yesterday and today without relief; she states her last dose was at ~1:30 PM. Pt reports she was outside working in her backyard 5 days ago but denies any tick or insect exposure. She also denies any nausea, vomiting, diarrhea, rash, exposure to sick contacts with similar symptoms or hx of asthma.  Past Medical History:  Diagnosis Date  . CIN I (cervical intraepithelial neoplasia I)   . Elevated cholesterol   . Fibroid   . Hypertension   . Ileus, postoperative ([place]) 04/29/2012  . Postpartum care following cesarean delivery (8/2) 04/29/2012  . Thrombocytopenia ([place]) 04/29/2012  . Umbilical hernia     Patient Active Problem List   Diagnosis Date Noted  . Postpartum care following cesarean delivery (8/2) 04/29/2012  . Ileus, postoperative ([place]) 04/29/2012  . Thrombocytopenia ([place]) 04/29/2012  . HTN in  pregnancy, chronic 04/25/2012  . 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 Elderly multigravida with antepartum condition or complication 67/54/4920  . Elevated cholesterol   . CIN I (cervical intraepithelial neoplasia I)   . Fibroid     Past Surgical History:  Procedure Laterality Date  . CESAREAN SECTION     X3  . COLPOSCOPY    . CYSTOSCOPY  04/25/2012   Procedure: CYSTOSCOPY;  Surgeon: Marvene Staff, MD;  Location: [facility];  Service: Gynecology;  Laterality: N/A;  . MYOMECTOMY  04/25/2012   Procedure: MYOMECTOMY;  Surgeon: Marvene Staff, MD;  Location: [facility];  Service: Gynecology;  Laterality: N/A;    OB History    Gravida Para Term Preterm AB Living   6 3 2 1 3 3    SAB TAB Ectopic Multiple Live Births   2 1     3      Home Medications    Prior to Admission medications   Medication Sig Start Date End Date Taking? Authorizing Provider  amLODipine (NORVASC) 10 MG tablet Take 10 mg by mouth daily.    [provider]  ciprofloxacin (CIPRO) 500 MG tablet Take 500 mg by mouth every 12 (twelve) hours. 07/04/15   [provider]  ibuprofen (ADVIL,MOTRIN) 600 MG tablet Take 1 tablet (600 mg total) by mouth every 6 (six) hours as needed. Patient taking differently: Take 600 mg by mouth every 6 (six) hours as needed for moderate pain.  07/01/15   Varney Biles, MD  LORazepam (ATIVAN) 1 MG tablet Take 0.5 mg by mouth every 8 (eight)  hours.    [provider]  metroNIDAZOLE (FLAGYL) 500 MG tablet Take 500 mg by mouth every 8 (eight) hours. 07/04/15   [provider]  oxyCODONE-acetaminophen (PERCOCET/ROXICET) 5-325 MG tablet Take 2 tablets by mouth every 4 (four) hours as needed for severe pain. 07/11/15   Marella Chimes, PA-C  promethazine (PHENERGAN) 25 MG tablet Take 1 tablet (25 mg total) by mouth every 6 (six) hours as needed for nausea. 07/01/15   Varney Biles, MD  traMADol  (ULTRAM) 50 MG tablet Take 1 tablet (50 mg total) by mouth every 6 (six) hours as needed. Patient taking differently: Take 50 mg by mouth every 6 (six) hours as needed for moderate pain.  07/01/15   Varney Biles, MD  valsartan-hydrochlorothiazide (DIOVAN-HCT) 80-12.5 MG tablet Take 1 tablet by mouth daily. 06/01/15   [provider]    Family History Family History  Problem Relation Age of Onset  . Hypertension Father   . Stroke Father   . Hypertension Mother   . Diabetes Maternal Grandmother   . Heart disease Paternal Grandfather     Social History Social History  Substance Use Topics  . Smoking status: Never Smoker  . Smokeless tobacco: Never Used  . Alcohol use No   Allergies   Patient has no known allergies.  Review of Systems Review of Systems  Constitutional: Positive for chills and fever.  HENT: Positive for sore throat and voice change.   Eyes: Positive for discharge.  Respiratory: Positive for cough.   Gastrointestinal: Negative for diarrhea, nausea and vomiting.  Musculoskeletal: Positive for back pain.  Skin: Negative for rash.  Neurological: Positive for dizziness, light-headedness and headaches.    Physical Exam Updated Vital Signs BP (!) 158/74 (BP Location: Right Arm)   Pulse (!) 103   Temp (!) 102.4 F (39.1 C) (Oral)   Resp 18   Ht 5\' 1"  (1.549 m)   Wt 151 lb (68.5 kg)   SpO2 99%   BMI 28.53 kg/m   Physical Exam  Constitutional: She is oriented to person, place, and time. She appears well-developed and well-nourished. No distress.  HENT:  Head: Normocephalic.  Mouth/Throat: No oropharyngeal exudate.  Oropharynx is erythematous   Eyes: Conjunctivae and EOM are normal. Right eye exhibits no discharge. Left eye exhibits no discharge.  Neck: Normal range of motion. Neck supple.  Cardiovascular: Regular rhythm and normal heart sounds.  Tachycardia present.   Pulmonary/Chest: Effort normal. No respiratory distress. She has no wheezes. She  has no rales.  Slightly decreased breath sounds on the right   Abdominal: She exhibits no distension.  Musculoskeletal: Normal range of motion.  Neurological: She is alert and oriented to person, place, and time.  Skin: Skin is warm and dry. She is not diaphoretic. No pallor.  Psychiatric: She has a normal mood and affect.  Nursing note and vitals reviewed.  ED Treatments / Results  DIAGNOSTIC STUDIES:  Oxygen Saturation is 99% on RA, normal by my interpretation.    COORDINATION OF CARE:  10:12 PM Discussed treatment plan with pt at bedside and pt agreed to plan.  Labs (all labs ordered are listed, but only abnormal results are displayed) Labs Reviewed  RAPID STREP SCREEN (NOT AT [facility])  CULTURE, GROUP A STREP [facility])  HIV ANTIBODY (ROUTINE TESTING)    EKG  EKG Interpretation None       Radiology Dg Chest 2 View  Result Date: 01/27/2017 CLINICAL DATA:  Productive cough.  Fever. EXAM: CHEST  2  VIEW COMPARISON:  September 02, 2014 FINDINGS: The heart size and mediastinal contours are within normal limits. Both lungs are clear. The visualized skeletal structures are unremarkable. IMPRESSION: No active cardiopulmonary disease. Electronically Signed   By: Dorise Bullion III M.D   On: 01/27/2017 22:43    Procedures Procedures (including critical care time)  Medications Ordered in ED Medications  acetaminophen (TYLENOL) tablet 650 mg (650 mg Oral Given 01/27/17 2104)   Initial Impression / Assessment and Plan / ED Course  I have reviewed the triage vital signs and the nursing notes.  Pertinent labs & imaging results that were available during my care of the patient were reviewed by me and considered in my medical decision making (see chart for details).     Patient presents with 3 days of sudden onset flu-like symptoms.  CXR negative. HIV antibody pending Patient wanted to know if she had strep. Rapid strep negative. Patient improved while in ED and fever subsided. She was  stable prior to discharge.  Discharge home with symptomatic relief and PCP follow up.  Advised rest and plenty of fluids. Patient is out of the window for tamiflu. Patient asked nursing staff why I wasn't testing her for flu. Provided patient with education and reassurance. She understood and agreed with clinical decision.  Discussed strict return precautions and advised to return to the emergency department if experiencing any new or worsening symptoms. Instructions were understood and patient agreed with discharge plan.  Final Clinical Impressions(s) / ED Diagnoses   Final diagnoses:  Flu-like symptoms    New Prescriptions New Prescriptions   No medications on file   I personally performed the services described in this documentation, which was scribed in my presence. The recorded information has been reviewed and is accurate.     Emeline General, PA-C 01/28/17 Lutricia Feil    Lajean Saver, MD 02/04/17 0730

## 2017-01-28 LAB — HIV ANTIBODY (ROUTINE TESTING W REFLEX): HIV SCREEN 4TH GENERATION: NONREACTIVE

## 2017-01-28 LAB — RAPID STREP SCREEN (MED CTR MEBANE ONLY): STREPTOCOCCUS, GROUP A SCREEN (DIRECT): NEGATIVE

## 2017-01-28 NOTE — Discharge Instructions (Signed)
As discussed, drink plenty of fluids and keep your urine clear and get rest. Continue with nasal spray, antihistamine. Tea with honey for your throat. Follow up with your primary care provider this week.  Your symptoms are consistent with a flu-like illness. Your exam and results were reassuring today.  Return to the ER if condition worsens, cough, fever, chills, nausea, vomiting, shortness of breath, chest pain, or other new concerning symptoms in the meantime.

## 2017-01-30 LAB — CULTURE, GROUP A STREP (THRC)

## 2019-03-07 ENCOUNTER — Ambulatory Visit (HOSPITAL_COMMUNITY)
Admission: EM | Admit: 2019-03-07 | Discharge: 2019-03-07 | Disposition: A | Discharge status: Home / Self Care | Payer: BC Managed Care – PPO | Attending: Physician Assistant | Admitting: Physician Assistant

## 2019-03-07 ENCOUNTER — Encounter (HOSPITAL_COMMUNITY): Payer: Self-pay

## 2019-03-07 DIAGNOSIS — S61210A Laceration without foreign body of right index finger without damage to nail, initial encounter: Secondary | ICD-10-CM

## 2019-03-07 DIAGNOSIS — W269XXA Contact with unspecified sharp object(s), initial encounter: Secondary | ICD-10-CM

## 2019-03-07 DIAGNOSIS — Z23 Encounter for immunization: Secondary | ICD-10-CM

## 2019-03-07 MED ORDER — TETANUS-DIPHTH-ACELL PERTUSSIS 5-2.5-18.5 LF-MCG/0.5 IM SUSP
INTRAMUSCULAR | Status: AC
  Filled 2019-03-07: qty 0.5

## 2019-03-07 MED ORDER — TETANUS-DIPHTH-ACELL PERTUSSIS 5-2.5-18.5 LF-MCG/0.5 IM SUSP
0.5000 mL | Freq: Once | INTRAMUSCULAR | Status: AC
  Administered 2019-03-07: 0.5 mL via INTRAMUSCULAR

## 2019-03-07 NOTE — ED Triage Notes (Signed)
Pt C/O laceration on her right index finger. Pt was trying to slice potatoes.  Pt is not update on her tetanus shot.

## 2019-03-07 NOTE — ED Provider Notes (Signed)
[facility]    CSN: [id number] Arrival date & time: 03/07/19  1511     History   Chief Complaint Chief Complaint  Patient presents with  . Laceration    right hand/index finger    HPI [name] is a 53 y.o. female.   53 year old female comes in for evaluation for laceration of the right index finger while slicing potatoes. States was unable to stop bleeding despite pressure and came in for evaluation. Denies decrease in ROM, numbness/tingling. States apart from applying pressure, has not done anything else to the wound. Unknown last tetanus.      Past Medical History:  Diagnosis Date  . CIN I (cervical intraepithelial neoplasia I)   . Elevated cholesterol   . Fibroid   . Hypertension   . Ileus, postoperative ([place]) 04/29/2012  . Postpartum care following cesarean delivery (8/2) 04/29/2012  . Thrombocytopenia ([place]) 04/29/2012  . Umbilical hernia     Patient Active Problem List   Diagnosis Date Noted  . Postpartum care following cesarean delivery (8/2) 04/29/2012  . Ileus, postoperative ([place]) 04/29/2012  . Thrombocytopenia ([place]) 04/29/2012  . HTN in pregnancy, chronic 04/25/2012  . 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 Elderly multigravida with antepartum condition or complication 64/40/3474  . Elevated cholesterol   . CIN I (cervical intraepithelial neoplasia I)   . Fibroid     Past Surgical History:  Procedure Laterality Date  . CESAREAN SECTION     X3  . COLPOSCOPY    . CYSTOSCOPY  04/25/2012   Procedure: CYSTOSCOPY;  Surgeon: Marvene Staff, MD;  Location: [facility];  Service: Gynecology;  Laterality: N/A;  . MYOMECTOMY  04/25/2012   Procedure: MYOMECTOMY;  Surgeon: Marvene Staff, MD;  Location: [facility];  Service: Gynecology;  Laterality: N/A;    OB History    Gravida  6   Para  3   Term  2   Preterm  1   AB  3   Living  3     SAB  2   TAB  1   Ectopic      Multiple      Live Births  3            Home Medications    Prior to Admission medications   Medication Sig Start Date End Date Taking? Authorizing Provider  amLODipine (NORVASC) 10 MG tablet Take 10 mg by mouth daily.    [provider]  ciprofloxacin (CIPRO) 500 MG tablet Take 500 mg by mouth every 12 (twelve) hours. 07/04/15   [provider]  ibuprofen (ADVIL,MOTRIN) 600 MG tablet Take 1 tablet (600 mg total) by mouth every 6 (six) hours as needed. Patient taking differently: Take 600 mg by mouth every 6 (six) hours as needed for moderate pain.  07/01/15   Varney Biles, MD  LORazepam (ATIVAN) 1 MG tablet Take 0.5 mg by mouth every 8 (eight) hours.    [provider]  metroNIDAZOLE (FLAGYL) 500 MG tablet Take 500 mg by mouth every 8 (eight) hours. 07/04/15   [provider]  oxyCODONE-acetaminophen (PERCOCET/ROXICET) 5-325 MG tablet Take 2 tablets by mouth every 4 (four) hours as needed for severe pain. 07/11/15   Marella Chimes, PA-C  promethazine (PHENERGAN) 25 MG tablet Take 1 tablet (25 mg total) by mouth every 6 (six) hours as needed for nausea. 07/01/15   Varney Biles, MD  traMADol (ULTRAM) 50 MG tablet Take  1 tablet (50 mg total) by mouth every 6 (six) hours as needed. Patient taking differently: Take 50 mg by mouth every 6 (six) hours as needed for moderate pain.  07/01/15   Varney Biles, MD  valsartan-hydrochlorothiazide (DIOVAN-HCT) 80-12.5 MG tablet Take 1 tablet by mouth daily. 06/01/15   [provider]    Family History Family History  Problem Relation Age of Onset  . Hypertension Father   . Stroke Father   . Hypertension Mother   . Diabetes Maternal Grandmother   . Heart disease Paternal Grandfather     Social History Social History   Tobacco Use  . Smoking status: Never Smoker  . Smokeless tobacco: Never Used  Substance Use Topics  . Alcohol use: No  . Drug use: No     Allergies   Patient has no known  allergies.   Review of Systems Review of Systems  Reason unable to perform ROS: See HPI as above.     Physical Exam Triage Vital Signs ED Triage Vitals  Enc Vitals Group     BP 03/07/19 1540 133/82     Pulse Rate 03/07/19 1540 62     Resp 03/07/19 1540 16     Temp 03/07/19 1540 98.2 F (36.8 C)     Temp Source 03/07/19 1540 Oral     SpO2 03/07/19 1540 97 %     Weight --      Height --      Head Circumference --      Peak Flow --      Pain Score 03/07/19 1543 4     Pain Loc --      Pain Edu? --      Excl. in [place]? --    No data found.  Updated Vital Signs BP 133/82 (BP Location: Right Arm)   Pulse 62   Temp 98.2 F (36.8 C) (Oral)   Resp 16   LMP 03/01/2019   SpO2 97%   Physical Exam Constitutional:      General: She is not in acute distress.    Appearance: She is well-developed. She is not diaphoretic.  HENT:     Head: Normocephalic and atraumatic.  Eyes:     Conjunctiva/sclera: Conjunctivae normal.     Pupils: Pupils are equal, round, and reactive to light.  Pulmonary:     Effort: Pulmonary effort is normal. No respiratory distress.  Musculoskeletal:     Comments: 1cm laceration to the distal right index finger, 0.2cm in depth. Bleeding controlled without pressure. Full ROM of finger. Strength normal, sensation intact. Cap refill <2s  Neurological:     Mental Status: She is alert and oriented to person, place, and time.      UC Treatments / Results  Labs (all labs ordered are listed, but only abnormal results are displayed) Labs Reviewed - No data to display  EKG None  Radiology No results found.  Procedures Procedures (including critical care time)  Medications Ordered in UC Medications  Tdap (BOOSTRIX) injection 0.5 mL (has no administration in time range)    Initial Impression / Assessment and Plan / UC Course  I have reviewed the triage vital signs and the nursing notes.  Pertinent labs & imaging results that were available during my  care of the patient were reviewed by me and considered in my medical decision making (see chart for details).    Discussed treatment options including no treatment versus Dermabond.  Patient would like to defer Dermabond at this time.  Wound irrigated and cleaned.  Wound care instructions given.  Patient would also like to update tetanus.  Tetanus updated.  Return precautions given.  Patient expresses undrestanding and agrees to plan.  Final Clinical Impressions(s) / UC Diagnoses   Final diagnoses:  Laceration of right index finger without foreign body without damage to nail, initial encounter   ED Prescriptions    None        Ok Edwards, PA-C 03/07/19 1631

## 2019-03-07 NOTE — Discharge Instructions (Signed)
Tetanus updated today. As discussed, no need for repair. You can buy over the counter liquid glue to prevent further pain with hand washing. Otherwise, monitor for spreading redness, increased warmth, purulent drainage, swelling, pain, follow up for reevaluation needed.

## 2019-09-07 ENCOUNTER — Encounter: Payer: Self-pay | Admitting: *Deleted

## 2019-09-08 ENCOUNTER — Ambulatory Visit: Payer: BC Managed Care – PPO | Admitting: Internal Medicine

## 2019-09-08 ENCOUNTER — Other Ambulatory Visit (INDEPENDENT_AMBULATORY_CARE_PROVIDER_SITE_OTHER): Payer: BC Managed Care – PPO

## 2019-09-08 ENCOUNTER — Ambulatory Visit (INDEPENDENT_AMBULATORY_CARE_PROVIDER_SITE_OTHER): Payer: BC Managed Care – PPO | Admitting: Internal Medicine

## 2019-09-08 ENCOUNTER — Ambulatory Visit: Payer: BC Managed Care – PPO | Admitting: Nurse Practitioner

## 2019-09-08 ENCOUNTER — Encounter: Payer: Self-pay | Admitting: Internal Medicine

## 2019-09-08 VITALS — BP 130/82 | HR 76 | Temp 98.2°F | Ht 61.0 in | Wt 159.8 lb

## 2019-09-08 DIAGNOSIS — R1030 Lower abdominal pain, unspecified: Secondary | ICD-10-CM

## 2019-09-08 LAB — COMPREHENSIVE METABOLIC PANEL
ALT: 15 U/L (ref 0–35)
AST: 15 U/L (ref 0–37)
Albumin: 4.3 g/dL (ref 3.5–5.2)
Alkaline Phosphatase: 53 U/L (ref 39–117)
BUN: 11 mg/dL (ref 6–23)
CO2: 27 mEq/L (ref 19–32)
Calcium: 9.2 mg/dL (ref 8.4–10.5)
Chloride: 102 mEq/L (ref 96–112)
Creatinine, Ser: 0.69 mg/dL (ref 0.40–1.20)
GFR: 107.96 mL/min (ref >60.00)
Glucose, Bld: 86 mg/dL (ref 70–99)
Potassium: 3.9 mEq/L (ref 3.5–5.1)
Sodium: 137 mEq/L (ref 135–145)
Total Bilirubin: 0.3 mg/dL (ref 0.2–1.2)
Total Protein: 7.2 g/dL (ref 6.0–8.3)

## 2019-09-08 LAB — CBC WITH DIFFERENTIAL/PLATELET
Basophils Absolute: 0 10*3/uL (ref 0.0–0.1)
Basophils Relative: 0.6 % (ref 0.0–3.0)
Eosinophils Absolute: 0.1 10*3/uL (ref 0.0–0.7)
Eosinophils Relative: 3.9 % (ref 0.0–5.0)
HCT: 41 % (ref 36.0–46.0)
Hemoglobin: 13.3 g/dL (ref 12.0–15.0)
Lymphocytes Relative: 52.6 % — ABNORMAL HIGH (ref 12.0–46.0)
Lymphs Abs: 1.8 10*3/uL (ref 0.7–4.0)
MCHC: 32.3 g/dL (ref 30.0–36.0)
MCV: 84.2 fl (ref 78.0–100.0)
Monocytes Absolute: 0.3 10*3/uL (ref 0.1–1.0)
Monocytes Relative: 9.6 % (ref 3.0–12.0)
Neutro Abs: 1.1 10*3/uL — ABNORMAL LOW (ref 1.4–7.7)
Neutrophils Relative %: 33.3 % — ABNORMAL LOW (ref 43.0–77.0)
Platelets: 287 10*3/uL (ref 150.0–400.0)
RBC: 4.87 Mil/uL (ref 3.87–5.11)
RDW: 14.2 % (ref 11.5–15.5)
WBC: 3.4 10*3/uL — ABNORMAL LOW (ref 4.0–10.5)

## 2019-09-08 NOTE — Patient Instructions (Signed)
Your provider has requested that you go to the basement level for lab work before leaving today. Press "B" on the elevator. The lab is located at the first door on the left as you exit the elevator.  You have been scheduled for a CT scan of the abdomen and pelvis at [facility] ([address]---this is in the same building as [employer]).   You are scheduled on Thursday, 09/10/19 at 9:00 am. You should arrive 15 minutes prior to your appointment time for registration. Please follow the written instructions below on the day of your exam:  WARNING: IF YOU ARE ALLERGIC TO IODINE/X-RAY DYE, PLEASE NOTIFY RADIOLOGY IMMEDIATELY AT [phone]! YOU WILL BE GIVEN A 13 HOUR PREMEDICATION PREP.  1) Do not eat or drink anything after 5:00 am (4 hours prior to your test) 2) You have been given 2 bottles of oral contrast to drink. The solution may taste better if refrigerated, but do NOT add ice or any other liquid to this solution. Shake well before drinking.    Drink 1 bottle of contrast @ 7:00 am (2 hours prior to your exam)  Drink 1 bottle of contrast @ 8:00 am (1 hour prior to your exam)  You may take any medications as prescribed with a small amount of water, if necessary. If you take any of the following medications: METFORMIN, GLUCOPHAGE, GLUCOVANCE, AVANDAMET, RIOMET, FORTAMET, [place] MET, JANUMET, GLUMETZA or METAGLIP, you MAY be asked to HOLD this medication 48 hours AFTER the exam.  The purpose of you drinking the oral contrast is to aid in the visualization of your intestinal tract. The contrast solution may cause some diarrhea. Depending on your individual set of symptoms, you may also receive an intravenous injection of x-ray contrast/dye. Plan on being at [facility] for 30 minutes or longer, depending on the type of exam you are having performed.  This test typically takes 30-45 minutes to complete.  If you have any questions regarding your exam or if you  need to reschedule, you may call the CT department at [phone] between the hours of 8:00 am and 5:00 pm, Monday-Friday.  ________________________________________________________  If you are age 52 or older, your body mass index should be between 23-30. Your Body mass index is 30.19 kg/m. If this is out of the aforementioned range listed, please consider follow up with your Primary Care Provider.  If you are age 6 or younger, your body mass index should be between 19-25. Your Body mass index is 30.19 kg/m. If this is out of the aformentioned range listed, please consider follow up with your Primary Care Provider.

## 2019-09-08 NOTE — Progress Notes (Signed)
Patient ID: [name], female   DOB: 02/26/1966, 53 y.o.   MRN: [id number] HPI: [name] is a 53 yo female with PMH of hypertension, uterine fibroids, history of complicated cesarean section in 2013 with removal of uterine fibroid complicated by bladder injury, history of intermittent small bowel obstruction status post lysis of adhesions in 2016 who is seen in consultation at the request of Dr. Criss Rosales to evaluate lower abdominal pain.  She is here alone today.  She reports that over the last 2 to 3 weeks she has been having episodic left lower quadrant abdominal pain which in the last week or so seems to have shifted to involve the right lower quadrant as well.  This occurs randomly and often when standing.  Is severe and doubles her over or brings her to her knees.  Starts quickly and lasts about 60 seconds before easing off.  No aggravating factors and no alleviating factors other than sitting down.  Does not relate to bowel movement.  No issues or change in bowel habit including no diarrhea or constipation.  No abdominal bloating.  No nausea or vomiting.  No heartburn.  No trouble swallowing.  Her menstrual cycles have been somewhat irregular though her last was on 08/23/2019.  She is active and lifts her 62-year-old son who has Down syndrome quite frequently.  No definite lower back pain though she has had some bilateral hip discomfort.  There is been no blood in her stool or melena.  She recalls a screening colonoscopy done at age 39, 74, in [place].  On further questioning she feels that this was at [facility].  She recalls this being normal and was told to repeat the test in 10 years.  There is no known family history of colorectal cancer.  She is a Pharmacist, hospital.  Never a smoker.  No alcohol use.   Past Medical History:  Diagnosis Date  . CIN I (cervical intraepithelial neoplasia I)   . Elevated cholesterol   . Fibroid   . Hypertension   . Ileus, postoperative ([facility]) 04/29/2012  . Ovarian  cyst   . Postpartum care following cesarean delivery (8/2) 04/29/2012  . Thrombocytopenia ([facility]) 04/29/2012  . Umbilical hernia     Past Surgical History:  Procedure Laterality Date  . ABDOMINAL EXPLORATION SURGERY     with lysis of adhesions (2013 with removal of fibroid and complicated by bladder injury)  . CESAREAN SECTION     X4  . COLPOSCOPY    . CYSTOSCOPY  04/25/2012   Procedure: CYSTOSCOPY;  Surgeon: Marvene Staff, MD;  Location: [facility];  Service: Gynecology;  Laterality: N/A;  . Intestinal blockage    . MYOMECTOMY  04/25/2012   Procedure: MYOMECTOMY;  Surgeon: Marvene Staff, MD;  Location: [facility];  Service: Gynecology;  Laterality: N/A;    Outpatient Medications Prior to Visit  Medication Sig Dispense Refill  . amLODipine (NORVASC) 10 MG tablet Take 10 mg by mouth daily.    Marland Kitchen ibuprofen (ADVIL,MOTRIN) 600 MG tablet Take 1 tablet (600 mg total) by mouth every 6 (six) hours as needed. (Patient taking differently: Take 600 mg by mouth every 6 (six) hours as needed for moderate pain. ) 30 tablet 0  . LORazepam (ATIVAN) 1 MG tablet Take 0.5 mg by mouth every 8 (eight) hours.    Marland Kitchen oxyCODONE-acetaminophen (PERCOCET/ROXICET) 5-325 MG tablet Take 2 tablets by mouth every 4 (four) hours as needed for severe pain. 10 tablet 0  . promethazine (PHENERGAN) 25  MG tablet Take 1 tablet (25 mg total) by mouth every 6 (six) hours as needed for nausea. 30 tablet 0  . traMADol (ULTRAM) 50 MG tablet Take 1 tablet (50 mg total) by mouth every 6 (six) hours as needed. (Patient taking differently: Take 50 mg by mouth every 6 (six) hours as needed for moderate pain. ) 15 tablet 0  . valsartan-hydrochlorothiazide (DIOVAN-HCT) 80-12.5 MG tablet Take 1 tablet by mouth daily.  0  . ciprofloxacin (CIPRO) 500 MG tablet Take 500 mg by mouth every 12 (twelve) hours.  0  . metroNIDAZOLE (FLAGYL) 500 MG tablet Take 500 mg by mouth every 8 (eight) hours.  0   No facility-administered medications prior to  visit.    No Known Allergies  Family History  Problem Relation Age of Onset  . Hypertension Father   . Stroke Father   . Hypertension Mother   . Diabetes Maternal Grandmother   . Heart disease Paternal Grandfather     Social History   Tobacco Use  . Smoking status: Never Smoker  . Smokeless tobacco: Never Used  Substance Use Topics  . Alcohol use: No  . Drug use: No    ROS: As per history of present illness, otherwise negative  BP 130/82 (BP Location: Left Arm, Patient Position: Sitting)   Pulse 76   Temp 98.2 F (36.8 C)   Ht 5\' 1"  (1.549 m)   Wt 159 lb 12.8 oz (72.5 kg)   SpO2 98%   BMI 30.19 kg/m  Constitutional: Well-developed and well-nourished. No distress. HEENT: Normocephalic and atraumatic.  Conjunctivae are normal.  No scleral icterus. Neck: Neck supple. Trachea midline. Cardiovascular: Normal rate, regular rhythm and intact distal pulses. No M/R/G Pulmonary/chest: Effort normal and breath sounds normal. No wheezing, rales or rhonchi. Abdominal: Soft, there is lower abdominal tenderness without rebound or guarding, nondistended. Bowel sounds active throughout. There are no masses palpable. No hepatosplenomegaly. Extremities: no clubbing, cyanosis, or edema Neurological: Alert and oriented to person place and time. Skin: Skin is warm and dry.  Psychiatric: Normal mood and affect. Behavior is normal.  Relevant prior imaging:  Pelvic US: IMPRESSION: 1. Normal exam. 2. Apparent ovarian cyst suggests on CT is not visualized sonographically. The CT finding likely reflected a normal ovary with a normal involuting follicular cyst. This is unlikely to be the source of this patient's symptoms. No evidence of ovarian torsion.     Electronically Signed   By: Lajean Manes M.D.   On: 07/01/2015 07:47  CT ABDOMEN AND PELVIS WITH CONTRAST   TECHNIQUE: Multidetector CT imaging of the abdomen and pelvis was performed using the standard protocol following  bolus administration of intravenous contrast.   CONTRAST:  143mL OMNIPAQUE IOHEXOL 300 MG/ML  SOLN   COMPARISON:  09/03/2014   FINDINGS: Lower chest:  No significant abnormality   Hepatobiliary: There are normal appearances of the liver, gallbladder and bile ducts.   Pancreas: Normal   Spleen: Normal   Adrenals/Urinary Tract: The adrenals and kidneys are normal in appearance. There is no urinary calculus evident. There is no hydronephrosis or ureteral dilatation. Collecting systems and ureters appear unremarkable.   Stomach/Bowel: The appendix is normal. Colon is unremarkable. There is enhancement of distal small bowel without evidence of obstruction. This is nonspecific but could represent enteritis. This is particularly prominent in the right lower quadrant. There is no significant bowel dilatation. There is no extraluminal air. A portion of the mid transverse colon protrudes into a wide-mouth umbilical hernia without  obstruction.   Vascular/Lymphatic: The abdominal aorta is normal in caliber. There is no atherosclerotic calcification. There is no adenopathy in the abdomen or pelvis.   Reproductive: There is a complex 3.3 cm left adnexal cyst. Uterus and right ovary are unremarkable.   Other: No ascites.   Musculoskeletal: No significant musculoskeletal lesions.   IMPRESSION: 1. Nonspecific nonobstructive mural enhancement of distal small bowel. This could represent enteritis. 2. Complex 3.3 cm left adnexal cyst, similar to the 09/03/2014 exam although there now is not as much adjacent fluid. Consider pelvic sonography for characterization.     Electronically Signed   By: Andreas Newport M.D.   On: 07/01/2015 03:10  ASSESSMENT/PLAN:  53 yo female with PMH of hypertension, uterine fibroids, history of complicated cesarean section in 2013 with removal of uterine fibroid complicated by bladder injury, history of intermittent small bowel obstruction status post  lysis of adhesions in 2016 who is seen in consultation at the request of Dr. Criss Rosales to evaluate lower abdominal pain.  1.  Bilateral lower abdominal pain --symptoms are intermittent and brief but severe.  Pain not consistent with colitis or diverticulitis.  Could be more neuropathic or musculoskeletal including lumbar disc disease or even referred pain from the hips.  I recommended we evaluate further with cross-sectional imaging and lab work --CBC, CMP --CT scan of the abdomen pelvis with IV contrast  2.  CRC screening --up-to-date with colon cancer screening per patient report.  We have requested these records from [facility].  If her recollection is correct, she would be due a repeat screening colonoscopy at age 39 in 2028   [id number], [place], [address] [place] [address] [place],  [place] [address]

## 2019-09-10 ENCOUNTER — Other Ambulatory Visit: Payer: Self-pay

## 2019-09-10 ENCOUNTER — Ambulatory Visit (INDEPENDENT_AMBULATORY_CARE_PROVIDER_SITE_OTHER)
Admission: RE | Admit: 2019-09-10 | Discharge: 2019-09-10 | Disposition: A | Discharge status: Home / Self Care | Payer: BC Managed Care – PPO | Source: Ambulatory Visit | Attending: Internal Medicine | Admitting: Internal Medicine

## 2019-09-10 DIAGNOSIS — R1030 Lower abdominal pain, unspecified: Secondary | ICD-10-CM | POA: Diagnosis not present

## 2019-09-10 MED ORDER — IOHEXOL 300 MG/ML  SOLN
100.0000 mL | Freq: Once | INTRAMUSCULAR | Status: AC | PRN
  Administered 2019-09-10: 09:00:00 100 mL via INTRAVENOUS

## 2020-04-08 ENCOUNTER — Other Ambulatory Visit: Payer: Self-pay

## 2020-04-08 ENCOUNTER — Encounter (HOSPITAL_COMMUNITY): Payer: Self-pay

## 2020-04-08 ENCOUNTER — Ambulatory Visit (HOSPITAL_COMMUNITY)
Admission: EM | Admit: 2020-04-08 | Discharge: 2020-04-08 | Disposition: A | Discharge status: Home / Self Care | Payer: BC Managed Care – PPO | Attending: Urgent Care | Admitting: Urgent Care

## 2020-04-08 DIAGNOSIS — L989 Disorder of the skin and subcutaneous tissue, unspecified: Secondary | ICD-10-CM

## 2020-04-08 NOTE — ED Triage Notes (Signed)
Pt states has mole on nosex3 yrs and states it's getting bigger and is concerned about it.

## 2020-04-08 NOTE — ED Provider Notes (Signed)
[facility]   MRN: [id number] DOB: [birth year]  Subjective:   [name] is a 54 y.o. female presenting for 3-year history of skin lesion over the right side of her face near her nose.  Patient would like them removed today.  No current facility-administered medications for this encounter.  Current Outpatient Medications:  .  amLODipine (NORVASC) 10 MG tablet, Take 10 mg by mouth daily., Disp: , Rfl:  .  valsartan-hydrochlorothiazide (DIOVAN-HCT) 80-12.5 MG tablet, Take 1 tablet by mouth daily., Disp: , Rfl: 0   No Known Allergies  Past Medical History:  Diagnosis Date  . CIN I (cervical intraepithelial neoplasia I)   . Elevated cholesterol   . Fibroid   . Hypertension   . Ileus, postoperative ([facility]) 04/29/2012  . Ovarian cyst   . Postpartum care following cesarean delivery (8/2) 04/29/2012  . Thrombocytopenia ([facility]) 04/29/2012  . Umbilical hernia      Past Surgical History:  Procedure Laterality Date  . ABDOMINAL EXPLORATION SURGERY     with lysis of adhesions (2013 with removal of fibroid and complicated by bladder injury)  . CESAREAN SECTION     X4  . COLPOSCOPY    . CYSTOSCOPY  04/25/2012   Procedure: CYSTOSCOPY;  Surgeon: Marvene Staff, MD;  Location: [facility];  Service: Gynecology;  Laterality: N/A;  . Intestinal blockage    . MYOMECTOMY  04/25/2012   Procedure: MYOMECTOMY;  Surgeon: Marvene Staff, MD;  Location: [facility];  Service: Gynecology;  Laterality: N/A;    Family History  Problem Relation Age of Onset  . Hypertension Father   . Stroke Father   . Hypertension Mother   . Diabetes Maternal Grandmother   . Heart disease Paternal Grandfather     Social History   Tobacco Use  . Smoking status: Never Smoker  . Smokeless tobacco: Never Used  Substance Use Topics  . Alcohol use: No  . Drug use: No    ROS   Objective:   Vitals: BP (!) 150/86   Pulse (!) 51   Temp 99.4 F (37.4 C) (Oral)   Resp 16   SpO2 96%   Physical  Exam Constitutional:      General: She is not in acute distress.    Appearance: Normal appearance. She is well-developed. She is not ill-appearing, toxic-appearing or diaphoretic.  HENT:     Head: Normocephalic and atraumatic.      Nose: Nose normal.     Mouth/Throat:     Mouth: Mucous membranes are moist.     Pharynx: Oropharynx is clear.  Eyes:     General: No scleral icterus.       Right eye: No discharge.        Left eye: No discharge.     Extraocular Movements: Extraocular movements intact.     Pupils: Pupils are equal, round, and reactive to light.  Cardiovascular:     Rate and Rhythm: Normal rate.  Pulmonary:     Effort: Pulmonary effort is normal.  Skin:    General: Skin is warm and dry.  Neurological:     General: No focal deficit present.     Mental Status: She is alert and oriented to person, place, and time.  Psychiatric:        Mood and Affect: Mood normal.        Behavior: Behavior normal.        Thought Content: Thought content normal.        Judgment: Judgment  normal.      Assessment and Plan :   PDMP not reviewed this encounter.  1. Skin lesion     No evidence of infection.  Recommended patient have this removed and biopsied with a dermatologist or plastic surgery. Counseled patient on potential for adverse effects with medications prescribed/recommended today, ER and return-to-clinic precautions discussed, patient verbalized understanding.    Jaynee Eagles, [place] 04/09/20 [phone]

## 2020-06-14 ENCOUNTER — Encounter (HOSPITAL_COMMUNITY): Payer: Self-pay | Admitting: Emergency Medicine

## 2020-06-14 ENCOUNTER — Ambulatory Visit (HOSPITAL_COMMUNITY)
Admission: EM | Admit: 2020-06-14 | Discharge: 2020-06-14 | Disposition: A | Discharge status: Home / Self Care | Payer: BC Managed Care – PPO | Attending: Family Medicine | Admitting: Family Medicine

## 2020-06-14 ENCOUNTER — Other Ambulatory Visit: Payer: Self-pay

## 2020-06-14 DIAGNOSIS — M542 Cervicalgia: Secondary | ICD-10-CM | POA: Diagnosis not present

## 2020-06-14 DIAGNOSIS — M545 Low back pain, unspecified: Secondary | ICD-10-CM

## 2020-06-14 DIAGNOSIS — S060X0A Concussion without loss of consciousness, initial encounter: Secondary | ICD-10-CM | POA: Diagnosis not present

## 2020-06-14 MED ORDER — CYCLOBENZAPRINE HCL 5 MG PO TABS: 5.0000 mg | ORAL_TABLET | Freq: Three times a day (TID) | ORAL | 0 refills | Status: DC | PRN

## 2020-06-14 MED ORDER — IBUPROFEN 600 MG PO TABS: 600.0000 mg | ORAL_TABLET | Freq: Three times a day (TID) | ORAL | 0 refills | Status: DC | PRN
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
## 2020-06-14 NOTE — Discharge Instructions (Signed)
Please try heat  Please try to stay active  The muscle relaxer may make you drowsy so please use at night  Please follow up with sports medicine  Please follow up if your symptoms fail to improve.

## 2020-06-15 ENCOUNTER — Other Ambulatory Visit: Payer: BC Managed Care – PPO

## 2020-06-15 DIAGNOSIS — Z20822 Contact with and (suspected) exposure to covid-19: Secondary | ICD-10-CM

## 2020-06-17 ENCOUNTER — Other Ambulatory Visit: Payer: Self-pay

## 2020-06-17 ENCOUNTER — Telehealth: Payer: Self-pay | Admitting: Family Medicine

## 2020-06-17 ENCOUNTER — Encounter: Payer: Self-pay | Admitting: Family Medicine

## 2020-06-17 ENCOUNTER — Ambulatory Visit (INDEPENDENT_AMBULATORY_CARE_PROVIDER_SITE_OTHER): Payer: BC Managed Care – PPO | Admitting: Family Medicine

## 2020-06-17 VITALS — BP 125/78 | Ht 61.0 in | Wt 150.0 lb

## 2020-06-17 DIAGNOSIS — S161XXA Strain of muscle, fascia and tendon at neck level, initial encounter: Secondary | ICD-10-CM

## 2020-06-17 DIAGNOSIS — S060X0A Concussion without loss of consciousness, initial encounter: Secondary | ICD-10-CM | POA: Diagnosis not present

## 2020-06-17 DIAGNOSIS — S39012A Strain of muscle, fascia and tendon of lower back, initial encounter: Secondary | ICD-10-CM | POA: Diagnosis not present

## 2020-06-17 LAB — NOVEL CORONAVIRUS, NAA: SARS-CoV-2, NAA: NOT DETECTED

## 2020-06-17 LAB — SARS-COV-2, NAA 2 DAY TAT
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
## 2020-06-22 ENCOUNTER — Ambulatory Visit (INDEPENDENT_AMBULATORY_CARE_PROVIDER_SITE_OTHER): Payer: BC Managed Care – PPO | Admitting: Family Medicine

## 2020-06-22 ENCOUNTER — Other Ambulatory Visit: Payer: Self-pay

## 2020-06-22 ENCOUNTER — Ambulatory Visit
Admission: RE | Admit: 2020-06-22 | Discharge: 2020-06-22 | Disposition: A | Discharge status: Home / Self Care | Payer: BC Managed Care – PPO | Source: Ambulatory Visit | Attending: Sports Medicine | Admitting: Sports Medicine

## 2020-06-22 ENCOUNTER — Encounter: Payer: Self-pay | Admitting: Family Medicine

## 2020-06-22 VITALS — BP 124/82 | Ht 61.0 in | Wt 150.0 lb

## 2020-06-22 DIAGNOSIS — M542 Cervicalgia: Secondary | ICD-10-CM | POA: Diagnosis not present

## 2020-06-22 DIAGNOSIS — M545 Low back pain, unspecified: Secondary | ICD-10-CM

## 2020-06-22 MED ORDER — MELOXICAM 15 MG PO TABS: 15.0000 mg | ORAL_TABLET | Freq: Every day | ORAL | 1 refills | Status: DC | PRN
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
## 2020-06-24 ENCOUNTER — Ambulatory Visit: Payer: BC Managed Care – PPO | Attending: Sports Medicine | Admitting: Physical Therapy

## 2020-06-24 ENCOUNTER — Other Ambulatory Visit: Payer: Self-pay

## 2020-06-24 ENCOUNTER — Encounter: Payer: Self-pay | Admitting: Physical Therapy

## 2020-06-24 DIAGNOSIS — M542 Cervicalgia: Secondary | ICD-10-CM | POA: Diagnosis present

## 2020-06-24 DIAGNOSIS — M5440 Lumbago with sciatica, unspecified side: Secondary | ICD-10-CM | POA: Insufficient documentation
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
## 2020-06-24 NOTE — Patient Instructions (Signed)
Access Code: [email or web address]: [email or web address]: 10/01/2021Prepared by: Anderson Malta PaaExercises  Supine Lower Trunk Rotation - 2 x daily - 7 x weekly - 2 sets - 10 reps - 10 hold  Supine Posterior Pelvic Tilt - 2 x daily - 7 x weekly - 2 sets - 10 reps - 10 hold  Supine Transversus Abdominis Bracing - Hands on Stomach - 2 x daily - 7 x weekly - 2 sets - 10 reps - 5 hold  Supine Cervical Retraction with Towel - 2 x daily - 7 x weekly - 2 sets - 10 reps - 5 hold  Seated Cervical Rotation AROM - 2 x daily - 7 x weekly - 1 sets - 10 reps - 30 hold  Child's Pose Stretch - 2 x daily - 7 x weekly - 1 sets - 5 reps - 30 hold

## 2020-06-28 ENCOUNTER — Ambulatory Visit: Payer: BC Managed Care – PPO | Admitting: Physical Therapy

## 2020-06-28 ENCOUNTER — Other Ambulatory Visit: Payer: Self-pay

## 2020-06-28 ENCOUNTER — Encounter: Payer: Self-pay | Admitting: Physical Therapy

## 2020-06-28 DIAGNOSIS — M5440 Lumbago with sciatica, unspecified side: Secondary | ICD-10-CM | POA: Diagnosis not present

## 2020-06-28 DIAGNOSIS — M542 Cervicalgia: Secondary | ICD-10-CM

## 2020-06-28 NOTE — Therapy (Signed)
[place], [place], [address] Phone: [phone]   Fax:  [phone]  Physical Therapy Treatment  Patient Details  Name: [name] MRN: [id number] Date of Birth: Nov 13, 1965 Referring Provider (PT): Dr. Shellia Cleverly    Encounter Date: 06/28/2020   PT End of Session - 06/28/20 1059    Visit Number 2    Number of Visits 12    Date for PT Re-Evaluation 08/19/20    PT Start Time 1050    PT Stop Time 1140    PT Time Calculation (min) 50 min           Past Medical History:  Diagnosis Date  . CIN I (cervical intraepithelial neoplasia I)   . Elevated cholesterol   . Fibroid   . Hypertension   . Ileus, postoperative ([place]) 04/29/2012  . Ovarian cyst   . Postpartum care following cesarean delivery (8/2) 04/29/2012  . Thrombocytopenia ([place]) 04/29/2012  . Umbilical hernia     Past Surgical History:  Procedure Laterality Date  . ABDOMINAL EXPLORATION SURGERY     with lysis of adhesions (2013 with removal of fibroid and complicated by bladder injury)  . CESAREAN SECTION     X4  . COLPOSCOPY    . CYSTOSCOPY  04/25/2012   Procedure: CYSTOSCOPY;  Surgeon: Marvene Staff, MD;  Location: [facility];  Service: Gynecology;  Laterality: N/A;  . Intestinal blockage    . MYOMECTOMY  04/25/2012   Procedure: MYOMECTOMY;  Surgeon: Marvene Staff, MD;  Location: [facility];  Service: Gynecology;  Laterality: N/A;    There were no vitals filed for this visit.   Subjective Assessment - 06/28/20 1052    Subjective I feel a little better. Back pain about 4/10, Upper 1/10.  Headache slight.  Pain in lower abdominals, could not lift leg into the car. Pt worried about her condition, wants to know whats wrong with her .                [facility] Adult PT Treatment/Exercise - 06/28/20 0001      Lumbar Exercises: Stretches   Active Hamstring Stretch 2 reps;30 seconds    Single Knee to Chest Stretch 2 reps;30  seconds    Single Knee to Chest Stretch Limitations cross to L shoulder     Lower Trunk Rotation Limitations x head turns x  10 x 15 sec arms out       Lumbar Exercises: Supine   Ab Set 10 reps    Pelvic Tilt 10 reps      Shoulder Exercises: Supine   Horizontal ABduction Strengthening;Both;10 reps    Theraband Level (Shoulder Horizontal ABduction) Level 1 (Yellow)    Horizontal ABduction Weight (lbs) core engaged ball      Shoulder Exercises: ROM/Strengthening   Nustep 6 min L3 UE and LE      Modalities   Modalities Moist Heat      Moist Heat Therapy   Number Minutes Moist Heat 10 Minutes    Moist Heat Location Cervical;Lumbar Spine      Neck Exercises: Stretches   Upper Trapezius Stretch 2 reps;30 seconds    Upper Trapezius Stretch Limitations sl dizzy     Levator Stretch 2 reps;30 seconds    Other Neck Stretches cervical rotation x 5 direction                     PT Short Term Goals - 06/24/20 1316  PT SHORT TERM GOAL #1   Title Pt will be I with HEP for neck and back    Time 4    Period Weeks    Status New    Target Date 07/22/20      PT SHORT TERM GOAL #2   Title Pt will understand FOTO results and potential to improve function    Time 2    Period Weeks    Status New    Target Date 07/08/20      PT SHORT TERM GOAL #3   Title Pt will notice centralization of pain in low back, LLE with normal daily activities.    Time 4    Period Weeks    Status New    Target Date 07/22/20             PT Long Term Goals - 06/24/20 1318      PT LONG TERM GOAL #1   Title FOTO score will improve to 35% or better in both neck and back    Time 8    Period Weeks    Status New    Target Date 08/19/20      PT LONG TERM GOAL #2   Title Pt will be able to sit, stand comfortably for 20 min at a time without increased neck or back pain    Time 8    Period Weeks    Status New    Target Date 08/19/20      PT LONG TERM GOAL #3   Title Pt will be able to  demo proper body mechanics for lifting, squatting and reaching > 10 lbs    Time 8    Period Weeks    Status New    Target Date 08/19/20      PT LONG TERM GOAL #4   Title Pt will be able to demo HEP independently for core and trunk flexibility    Time 8    Period Weeks    Status New    Target Date 08/19/20                 Plan - 06/28/20 1133    Clinical Impression Statement Reviewed HEP, did not add on at this time.  She is definitely more mobile but still moves cautiously and guarded.  Rt hamstring tension better, did not cause negative back pain.  Cont POC    PT Treatment/Interventions ADLs/Self Care Home Management;Moist Heat;Aquatic Therapy;Therapeutic exercise;Passive range of motion;Spinal Manipulations;Taping;Manual techniques;Neuromuscular re-education;Balance training;Patient/family education;Functional mobility training;Therapeutic activities;Traction;Cryotherapy;Electrical Stimulation;Dry needling    PT Next Visit Plan modalities for pain and check HEP, gentle ROM and core strength    PT Home Exercise Plan [email or web address]: cervical retract, rotaiton, PPT, LTR, childs pose, TrA    Consulted and Agree with Plan of Care Patient           Patient will benefit from skilled therapeutic intervention in order to improve the following deficits and impairments:  Decreased activity tolerance, Decreased range of motion, Decreased strength, Increased fascial restricitons, Impaired UE functional use, Pain, Postural dysfunction, Impaired flexibility, Increased muscle spasms, Difficulty walking, Decreased mobility  Visit Diagnosis: Acute midline low back pain with sciatica, sciatica laterality unspecified  Cervicalgia     Problem List Patient Active Problem List   Diagnosis Date Noted  . Postpartum care following cesarean delivery (8/2) 04/29/2012  . Ileus, postoperative ([place]) 04/29/2012  . Thrombocytopenia ([place]) 04/29/2012  . HTN in pregnancy, chronic 04/25/2012  . Chromosomal  abnormality in fetus,  affecting management of mother, antepartum 04/17/2012  . Elderly multigravida with antepartum condition or complication 67/67/2094  . Elevated cholesterol   . CIN I (cervical intraepithelial neoplasia I)   . Fibroid     Mandisa Persinger 06/28/2020, 11:35 AM  [facility] [address] [place], [place], [address] Phone: [phone]   Fax:  [phone]  Name: SAVITA RUNNER MRN: [id number] Date of Birth: 1966-02-03  Raeford Razor, PT 06/28/20 11:36 AM Phone: [phone] Fax: [phone]

## 2020-06-29 ENCOUNTER — Encounter: Payer: Self-pay | Admitting: Family Medicine

## 2020-06-29 ENCOUNTER — Ambulatory Visit (INDEPENDENT_AMBULATORY_CARE_PROVIDER_SITE_OTHER): Payer: Self-pay | Admitting: Family Medicine

## 2020-06-29 VITALS — BP 118/80 | Ht 61.0 in | Wt 150.0 lb

## 2020-06-29 DIAGNOSIS — M545 Low back pain, unspecified: Secondary | ICD-10-CM

## 2020-06-29 DIAGNOSIS — S060X0D Concussion without loss of consciousness, subsequent encounter: Secondary | ICD-10-CM

## 2020-06-29 DIAGNOSIS — M542 Cervicalgia: Secondary | ICD-10-CM
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
## 2020-06-30 ENCOUNTER — Ambulatory Visit: Payer: BC Managed Care – PPO | Admitting: Physical Therapy

## 2020-07-13 ENCOUNTER — Ambulatory Visit: Payer: BC Managed Care – PPO | Admitting: Family Medicine

## 2020-07-13 ENCOUNTER — Ambulatory Visit: Payer: BC Managed Care – PPO | Admitting: Physical Therapy

## 2020-07-18 ENCOUNTER — Encounter: Payer: Self-pay | Admitting: Physical Therapy

## 2020-07-18 ENCOUNTER — Ambulatory Visit: Payer: BC Managed Care – PPO | Admitting: Physical Therapy

## 2020-07-18 ENCOUNTER — Other Ambulatory Visit: Payer: Self-pay

## 2020-07-18 DIAGNOSIS — M5440 Lumbago with sciatica, unspecified side: Secondary | ICD-10-CM | POA: Diagnosis not present

## 2020-07-18 DIAGNOSIS — M542 Cervicalgia: Secondary | ICD-10-CM

## 2020-07-18 NOTE — Therapy (Signed)
[place], [place], [address] Phone: [phone]   Fax:  [phone]  Physical Therapy Treatment  Patient Details  Name: [name] MRN: [id number] Date of Birth: Feb 27, 1966 Referring Provider (PT): Dr. Shellia Cleverly    Encounter Date: 07/18/2020   PT End of Session - 07/18/20 1422    Visit Number 3    Number of Visits 12    Date for PT Re-Evaluation 08/19/20    PT Start Time 6599    PT Stop Time 3570    PT Time Calculation (min) 40 min    Activity Tolerance Patient tolerated treatment well    Behavior During Therapy [facility] for tasks assessed/performed           Past Medical History:  Diagnosis Date   CIN I (cervical intraepithelial neoplasia I)    Elevated cholesterol    Fibroid    Hypertension    Ileus, postoperative ([place]) 04/29/2012   Ovarian cyst    Postpartum care following cesarean delivery (8/2) 04/29/2012   Thrombocytopenia ([place]) 09/30/7937   Umbilical hernia     Past Surgical History:  Procedure Laterality Date   ABDOMINAL EXPLORATION SURGERY     with lysis of adhesions (2013 with removal of fibroid and complicated by bladder injury)   CESAREAN SECTION     X4   COLPOSCOPY     CYSTOSCOPY  04/25/2012   Procedure: CYSTOSCOPY;  Surgeon: Marvene Staff, MD;  Location: [facility];  Service: Gynecology;  Laterality: N/A;   Intestinal blockage     MYOMECTOMY  04/25/2012   Procedure: MYOMECTOMY;  Surgeon: Marvene Staff, MD;  Location: [facility];  Service: Gynecology;  Laterality: N/A;    There were no vitals filed for this visit.   Subjective Assessment - 07/18/20 1408    Subjective Patient has been going to chiropractor for 3 weeks.  Feels so much better.    Currently in Pain? Yes    Pain Score 3     Pain Location Back    Pain Orientation Right;Lower    Pain Descriptors / Indicators Aching    Pain Type Chronic pain    Pain Onset More than a month ago    Pain  Frequency Intermittent    Aggravating Factors  standing, activity    Pain Relieving Factors meds, rest, chiro    Effect of Pain on Daily Activities not working , family care impacted    Multiple Pain Sites No              [facility] Adult PT Treatment/Exercise - 07/18/20 0001      Lumbar Exercises: Stretches   Lower Trunk Rotation 10 seconds    Lower Trunk Rotation Limitations x 10     Pelvic Tilt 10 reps      Lumbar Exercises: Aerobic   Nustep L6 Ue and LE for 8 min       Lumbar Exercises: Standing   Functional Squats 10 reps    Functional Squats Limitations 10 lbs KB     Lifting From floor;From waist;15 reps    Lifting Limitations hip hinge 0 lbs then 15 resps with 10 lb KB       Lumbar Exercises: Supine   Clam 20 reps    Bent Knee Raise 10 reps    Bent Knee Raise Limitations reverse, hands under tailbone     Dead Bug 10 reps    Bridge 10 reps  PT Education - 07/18/20 1703    Education Details lifting, body mechanics, core    Person(s) Educated Patient    Methods Explanation;Demonstration;Handout    Comprehension Returned demonstration;Verbalized understanding            PT Short Term Goals - 07/18/20 1413      PT SHORT TERM GOAL #1   Title Pt will be I with HEP for neck and back    Baseline given core today    Status On-going      PT SHORT TERM GOAL #2   Title Pt will understand FOTO results and potential to improve function    Status Unable to assess      PT SHORT TERM GOAL #3   Title Pt will notice centralization of pain in low back, LLE with normal daily activities.    Baseline improved, some mild pinching in L thigh    Status On-going             PT Long Term Goals - 07/18/20 1414      PT LONG TERM GOAL #1   Title FOTO score will improve to 35% or better in both neck and back    Status Unable to assess      PT LONG TERM GOAL #2   Title Pt will be able to sit, stand comfortably for 20 min at a time without increased  neck or back pain    Baseline stiffens in AM , can sit for 20 min but not 1 hour, stood in church 30 min    Status Partially Met      PT LONG TERM GOAL #3   Title Pt will be able to demo proper body mechanics for lifting, squatting and reaching > 10 lbs    Baseline in progress    Status On-going      PT LONG TERM GOAL #4   Title Pt will be able to demo HEP independently for core and trunk flexibility    Status On-going                 Plan - 07/18/20 1415    Clinical Impression Statement Patient has been improving steadily. She is having chiropractic adjustments in her neck and low back. Given core today and initiated lifting, body mechanics , some mild low back pain with hinging. If she continued to feel good, will consider DC depending on her next visit and the MD visit.    PT Treatment/Interventions ADLs/Self Care Home Management;Moist Heat;Aquatic Therapy;Therapeutic exercise;Passive range of motion;Spinal Manipulations;Taping;Manual techniques;Neuromuscular re-education;Balance training;Patient/family education;Functional mobility training;Therapeutic activities;Traction;Cryotherapy;Electrical Stimulation;Dry needling    PT Next Visit Plan how was MD visit? Con vs DC.  FOTO.  Check HEP, goals    PT Home Exercise Plan [phone]: cervical retract, rotaiton, PPT, LTR, childs pose, TrA    Consulted and Agree with Plan of Care Patient           Patient will benefit from skilled therapeutic intervention in order to improve the following deficits and impairments:  Decreased activity tolerance, Decreased range of motion, Decreased strength, Increased fascial restricitons, Impaired UE functional use, Pain, Postural dysfunction, Impaired flexibility, Increased muscle spasms, Difficulty walking, Decreased mobility  Visit Diagnosis: Acute midline low back pain with sciatica, sciatica laterality unspecified  Cervicalgia     Problem List Patient Active Problem List   Diagnosis Date  Noted   Postpartum care following cesarean delivery (8/2) 04/29/2012   Ileus, postoperative ([place]) 04/29/2012   Thrombocytopenia ([place]) 04/29/2012  HTN in pregnancy, chronic 04/25/2012   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

## 2020-07-20 ENCOUNTER — Ambulatory Visit (INDEPENDENT_AMBULATORY_CARE_PROVIDER_SITE_OTHER): Payer: Self-pay | Admitting: Family Medicine

## 2020-07-20 ENCOUNTER — Other Ambulatory Visit: Payer: Self-pay

## 2020-07-20 ENCOUNTER — Ambulatory Visit: Payer: BC Managed Care – PPO | Admitting: Family Medicine

## 2020-07-20 VITALS — BP 118/82 | Ht 61.0 in | Wt 158.0 lb

## 2020-07-20 DIAGNOSIS — M545 Low back pain, unspecified: Secondary | ICD-10-CM

## 2020-07-20 DIAGNOSIS — M542 Cervicalgia: Secondary | ICD-10-CM

## 2020-07-20 DIAGNOSIS — S060X0D Concussion without loss of consciousness, subsequent encounter: Secondary | ICD-10-CM

## 2020-07-20 NOTE — Patient Instructions (Signed)
You are slowly improving. Continue physical therapy - we will see if we can add vestibular therapy to this. We will also refer you for cognitive behavioral therapy. Continue your lexapro. Flexeril just if needed. Do home exercises on days you don't go tot herapy. Follow up with Dr. Buena Irish in 3-4 weeks.

## 2020-07-21 ENCOUNTER — Encounter: Payer: Self-pay | Admitting: Family Medicine
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
## 2020-07-28 ENCOUNTER — Other Ambulatory Visit: Payer: Self-pay

## 2020-07-28 ENCOUNTER — Encounter: Payer: Self-pay | Admitting: Physical Therapy

## 2020-07-28 ENCOUNTER — Ambulatory Visit: Payer: BC Managed Care – PPO | Attending: Sports Medicine | Admitting: Physical Therapy

## 2020-07-28 DIAGNOSIS — M542 Cervicalgia: Secondary | ICD-10-CM | POA: Diagnosis present

## 2020-07-28 DIAGNOSIS — M5440 Lumbago with sciatica, unspecified side: Secondary | ICD-10-CM

## 2020-07-28 NOTE — Patient Instructions (Signed)
Access Code: [email or web address]: [email or web address]: 11/04/2021Prepared by: Anderson Malta PaaExercises  Supine Lower Trunk Rotation - 2 x daily - 7 x weekly - 2 sets - 10 reps - 10 hold  Supine Posterior Pelvic Tilt - 2 x daily - 7 x weekly - 2 sets - 10 reps - 10 hold  Supine Transversus Abdominis Bracing - Hands on Stomach - 2 x daily - 7 x weekly - 2 sets - 10 reps - 5 hold  Supine Cervical Retraction with Towel - 2 x daily - 7 x weekly - 2 sets - 10 reps - 5 hold  Seated Cervical Rotation AROM - 2 x daily - 7 x weekly - 1 sets - 10 reps - 30 hold  Child's Pose Stretch - 2 x daily - 7 x weekly - 1 sets - 5 reps - 30 hold  Supine Bridge - 1 x daily - 7 x weekly - 2 sets - 10 reps - 5 hold  Supine 90/90 Alternating Heel Touches with Posterior Pelvic Tilt - 1 x daily - 7 x weekly - 2 sets - 10 reps - 5 hold  Bent Knee Fallouts - 1 x daily - 7 x weekly - 2 sets - 10 reps - 5 hold  Supine Dead Bug with Leg Extension - 1 x daily - 7 x weekly - 2 sets - 10 reps - 5 hold  Beginner Front Arm Support - 1 x daily - 7 x weekly - 1-2 sets - 10 reps - 5 hold  Bird Dog - 1 x daily - 7 x weekly - 1-2 sets - 10 reps - 5 hold

## 2020-07-28 NOTE — Therapy (Addendum)
[place], [place], [address] Phone: [phone]   Fax:  [phone]  Physical Therapy Treatment/Discharge   Patient Details  Name: [name] MRN: [id number] Date of Birth: 24-Dec-1965 Referring Provider (PT): Dr. Shellia Cleverly    Encounter Date: 07/28/2020   PT End of Session - 07/28/20 1249    Visit Number 4    Number of Visits 12    Date for PT Re-Evaluation 08/19/20    PT Start Time 1216    PT Stop Time 1258    PT Time Calculation (min) 42 min    Activity Tolerance Patient tolerated treatment well    Behavior During Therapy [facility] for tasks assessed/performed           Past Medical History:  Diagnosis Date  . CIN I (cervical intraepithelial neoplasia I)   . Elevated cholesterol   . Fibroid   . Hypertension   . Ileus, postoperative ([place]) 04/29/2012  . Ovarian cyst   . Postpartum care following cesarean delivery (8/2) 04/29/2012  . Thrombocytopenia ([place]) 04/29/2012  . Umbilical hernia     Past Surgical History:  Procedure Laterality Date  . ABDOMINAL EXPLORATION SURGERY     with lysis of adhesions (2013 with removal of fibroid and complicated by bladder injury)  . CESAREAN SECTION     X4  . COLPOSCOPY    . CYSTOSCOPY  04/25/2012   Procedure: CYSTOSCOPY;  Surgeon: Marvene Staff, MD;  Location: [facility];  Service: Gynecology;  Laterality: N/A;  . Intestinal blockage    . MYOMECTOMY  04/25/2012   Procedure: MYOMECTOMY;  Surgeon: Marvene Staff, MD;  Location: [facility];  Service: Gynecology;  Laterality: N/A;    There were no vitals filed for this visit.   Subjective Assessment - 07/28/20 1223    Subjective Seeing chiro 3 x per week.  She is still having pain in Rt low back .  Neck only hurts if I turn too far. Still not going back to work, 4 more weeks.    Currently in Pain? Yes    Pain Score 2     Pain Location Back    Pain Orientation Right;Lower    Pain Descriptors /  Indicators Aching    Pain Type Chronic pain    Pain Onset More than a month ago    Pain Frequency Intermittent              [facility] PT Assessment - 07/28/20 0001      AROM   Cervical Flexion 45    Cervical Extension 60    Cervical - Right Side Bend 39    Cervical - Left Side Bend 30    Cervical - Right Rotation 75    Cervical - Left Rotation 60    Lumbar Flexion WFL     Lumbar Extension WFL     Lumbar - Right Side Bend WNL     Lumbar - Left Side Bend 15% min pain on R     Lumbar - Right Rotation 25% pain on Rt    Lumbar - Left Rotation 25% no pain       Strength   Right Shoulder Flexion 4-/5    Right Shoulder ABduction 4+/5    Left Shoulder Flexion 4-/5    Left Shoulder ABduction 4+/5    Right Elbow Flexion 5/5    Left Elbow Flexion 5/5    Right Hip Flexion 4+/5    Left Hip Flexion  4+/5               [facility] Adult PT Treatment/Exercise - 07/28/20 0001      Self-Care   Self-Care Posture;Other Self-Care Comments    Other Self-Care Comments  HEP, core, FOTO, progress , goals and chiropractic       Lumbar Exercises: Quadruped   Madcat/Old Horse 10 reps    Single Arm Raise 10 reps    Straight Leg Raise 10 reps    Opposite Arm/Leg Raise 10 reps    Other Quadruped Lumbar Exercises childs pose foward and laterals, encouraged post tilt for more flexion stretching     Other Quadruped Lumbar Exercises neck tension stretches afterwards                   PT Education - 07/28/20 1851    Education Details HEP, core and discharge, FOTO    Person(s) Educated Patient    Methods Explanation;Demonstration    Comprehension Verbalized understanding;Returned demonstration            PT Short Term Goals - 07/18/20 1413      PT SHORT TERM GOAL #1   Title Pt will be I with HEP for neck and back    Baseline given core today    Status On-going      PT SHORT TERM GOAL #2   Title Pt will understand FOTO results and potential to improve function    Status Unable to  assess      PT SHORT TERM GOAL #3   Title Pt will notice centralization of pain in low back, LLE with normal daily activities.    Baseline improved, some mild pinching in L thigh    Status On-going             PT Long Term Goals - 07/28/20 1236      PT LONG TERM GOAL #1   Title FOTO score will improve to 35% or better in both neck and back    Baseline 41%, 44% respectively    Status Partially Met      PT LONG TERM GOAL #2   Title Pt will be able to sit, stand comfortably for 20 min at a time without increased neck or back pain    Status Achieved      PT LONG TERM GOAL #3   Title Pt will be able to demo proper body mechanics for lifting, squatting and reaching > 10 lbs    Status Achieved      PT LONG TERM GOAL #4   Title Pt will be able to demo HEP independently for core and trunk flexibility    Status Achieved                 Plan - 07/28/20 1234    Clinical Impression Statement [name] has improved a great deal since evaluation.  At this point, She feels ready to discharge, will continue her HEP and finish out her chiropractic appts.  Goals are met, other than FOTO (lacking < 10% from goal) .    Examination-Participation Restrictions Church;Interpersonal Relationship;Occupation;Volunteer;Cleaning;Laundry;Community Activity;Driving;Shop;Meal Prep    PT Treatment/Interventions ADLs/Self Care Home Management;Moist Heat;Aquatic Therapy;Therapeutic exercise;Passive range of motion;Spinal Manipulations;Taping;Manual techniques;Neuromuscular re-education;Balance training;Patient/family education;Functional mobility training;Therapeutic activities;Traction;Cryotherapy;Electrical Stimulation;Dry needling    PT Next Visit Plan DC    PT Home Exercise Plan [phone]: cervical retract, rotaiton, PPT, LTR, childs pose, TrA, q-ped    Consulted and Agree with Plan of Care Patient  Patient will benefit from skilled therapeutic intervention in order to improve the following  deficits and impairments:  Decreased activity tolerance, Decreased range of motion, Decreased strength, Increased fascial restricitons, Impaired UE functional use, Pain, Postural dysfunction, Impaired flexibility, Increased muscle spasms, Difficulty walking, Decreased mobility  Visit Diagnosis: Acute midline low back pain with sciatica, sciatica laterality unspecified  Cervicalgia     Problem List Patient Active Problem List   Diagnosis Date Noted  . Hypercholesteremia 07/13/2015  . Thrombocytopenia ([place]) 04/29/2012  . HTN in pregnancy, chronic 04/25/2012  . 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 Elderly multigravida with antepartum condition or complication 43/60/6770  . CIN I (cervical intraepithelial neoplasia I)   . Fibroid     Chan Sheahan 07/28/2020, 7:04 PM  [place], [place], [address] Phone: [phone]   Fax:  [phone]  Name: ISOBEL EISENHUTH MRN: [id number] Date of Birth: [birth year]  Raeford Razor, PT 07/28/20 7:04 PM Phone: [phone] Fax: [phone]  PHYSICAL THERAPY DISCHARGE SUMMARY  Visits from Start of Care: 4  Current functional level related to goals / functional outcomes: See above    Remaining deficits: Pain with end range Rt neck rotation, Rt sided low back pain intermittent and mild overall. UE strength limited    Education / Equipment: HEP, core, pain relief strategies   Plan: Patient agrees to discharge.  Patient goals were met. Patient is being discharged due to being pleased with the current functional level.  ?????    Raeford Razor, PT 07/28/20 7:05 PM Phone: [phone] Fax: [phone]

## 2020-08-10 ENCOUNTER — Ambulatory Visit (INDEPENDENT_AMBULATORY_CARE_PROVIDER_SITE_OTHER): Payer: Self-pay | Admitting: Family Medicine

## 2020-08-10 ENCOUNTER — Other Ambulatory Visit: Payer: Self-pay

## 2020-08-10 ENCOUNTER — Encounter: Payer: Self-pay | Admitting: Family Medicine

## 2020-08-10 VITALS — BP 130/80 | Ht 61.0 in | Wt 158.0 lb

## 2020-08-10 DIAGNOSIS — S060X0D Concussion without loss of consciousness, subsequent encounter: Secondary | ICD-10-CM

## 2020-08-10 DIAGNOSIS — M542 Cervicalgia: Secondary | ICD-10-CM

## 2020-08-10 DIAGNOSIS — M545 Low back pain, unspecified: Secondary | ICD-10-CM

## 2020-08-10 MED ORDER — ESCITALOPRAM OXALATE 20 MG PO TABS: 20.0000 mg | ORAL_TABLET | Freq: Every day | ORAL | 1 refills | Status: AC
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
## 2020-08-12 ENCOUNTER — Encounter: Payer: Self-pay | Admitting: Psychology

## 2020-09-05 ENCOUNTER — Other Ambulatory Visit: Payer: Self-pay

## 2020-09-05 ENCOUNTER — Encounter: Payer: BC Managed Care – PPO | Attending: Psychology | Admitting: Psychology

## 2020-09-05 ENCOUNTER — Encounter: Payer: Self-pay | Admitting: Psychology

## 2020-09-05 DIAGNOSIS — F0781 Postconcussional syndrome: Secondary | ICD-10-CM | POA: Diagnosis present

## 2020-09-05 DIAGNOSIS — F4323 Adjustment disorder with mixed anxiety and depressed mood: Secondary | ICD-10-CM | POA: Diagnosis present
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
## 2020-09-12 ENCOUNTER — Encounter: Payer: BC Managed Care – PPO | Admitting: Psychology

## 2020-09-12 ENCOUNTER — Ambulatory Visit: Payer: BC Managed Care – PPO | Admitting: Psychology

## 2020-09-19 ENCOUNTER — Ambulatory Visit: Payer: BC Managed Care – PPO | Admitting: Psychology

## 2020-09-19 ENCOUNTER — Encounter: Payer: Self-pay | Admitting: Psychology

## 2020-09-19 ENCOUNTER — Encounter: Payer: BC Managed Care – PPO | Admitting: Psychology

## 2020-09-19 ENCOUNTER — Other Ambulatory Visit: Payer: Self-pay

## 2020-09-19 DIAGNOSIS — F4323 Adjustment disorder with mixed anxiety and depressed mood: Secondary | ICD-10-CM | POA: Diagnosis not present

## 2020-09-19 DIAGNOSIS — F0781 Postconcussional syndrome: Secondary | ICD-10-CM
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
## 2020-09-26 ENCOUNTER — Encounter: Payer: BC Managed Care – PPO | Attending: Psychology | Admitting: Psychology

## 2020-09-26 ENCOUNTER — Encounter: Payer: Self-pay | Admitting: Psychology

## 2020-09-26 ENCOUNTER — Other Ambulatory Visit: Payer: Self-pay

## 2020-09-26 ENCOUNTER — Ambulatory Visit: Payer: BC Managed Care – PPO | Admitting: Psychology

## 2020-09-26 DIAGNOSIS — F0781 Postconcussional syndrome: Secondary | ICD-10-CM | POA: Diagnosis not present

## 2020-09-26 DIAGNOSIS — F4323 Adjustment disorder with mixed anxiety and depressed mood: Secondary | ICD-10-CM | POA: Diagnosis not present
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
## 2020-09-28 ENCOUNTER — Other Ambulatory Visit: Payer: Self-pay

## 2020-09-28 ENCOUNTER — Encounter: Payer: Self-pay | Admitting: Family Medicine

## 2020-09-28 ENCOUNTER — Ambulatory Visit (INDEPENDENT_AMBULATORY_CARE_PROVIDER_SITE_OTHER): Payer: Self-pay | Admitting: Family Medicine

## 2020-09-28 VITALS — BP 118/84 | Ht 61.0 in | Wt 158.0 lb

## 2020-09-28 DIAGNOSIS — S060X0D Concussion without loss of consciousness, subsequent encounter: Secondary | ICD-10-CM

## 2020-09-28 DIAGNOSIS — M545 Low back pain, unspecified: Secondary | ICD-10-CM
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
## 2020-10-03 ENCOUNTER — Ambulatory Visit: Payer: BC Managed Care – PPO | Admitting: Psychology

## 2020-10-04 ENCOUNTER — Encounter: Payer: BC Managed Care – PPO | Admitting: Psychology

## 2020-10-10 ENCOUNTER — Ambulatory Visit: Payer: BC Managed Care – PPO | Admitting: Psychology

## 2020-10-11 ENCOUNTER — Ambulatory Visit: Payer: BC Managed Care – PPO | Admitting: Psychology

## 2020-10-17 ENCOUNTER — Ambulatory Visit: Payer: BC Managed Care – PPO | Admitting: Psychology

## 2020-10-18 ENCOUNTER — Other Ambulatory Visit: Payer: Self-pay

## 2020-10-18 ENCOUNTER — Encounter: Payer: BC Managed Care – PPO | Attending: Psychology | Admitting: Psychology

## 2020-10-18 ENCOUNTER — Encounter: Payer: Self-pay | Admitting: Psychology

## 2020-10-18 DIAGNOSIS — F0781 Postconcussional syndrome: Secondary | ICD-10-CM | POA: Diagnosis present

## 2020-10-18 DIAGNOSIS — F4323 Adjustment disorder with mixed anxiety and depressed mood: Secondary | ICD-10-CM | POA: Diagnosis present
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
## 2020-10-24 ENCOUNTER — Ambulatory Visit: Payer: BC Managed Care – PPO | Admitting: Psychology

## 2020-10-25 ENCOUNTER — Encounter: Payer: BC Managed Care – PPO | Attending: Psychology | Admitting: Psychology

## 2020-10-25 ENCOUNTER — Other Ambulatory Visit: Payer: Self-pay

## 2020-10-25 ENCOUNTER — Encounter: Payer: Self-pay | Admitting: Psychology

## 2020-10-25 DIAGNOSIS — F4323 Adjustment disorder with mixed anxiety and depressed mood: Secondary | ICD-10-CM | POA: Insufficient documentation

## 2020-10-25 DIAGNOSIS — F0781 Postconcussional syndrome: Secondary | ICD-10-CM | POA: Diagnosis not present
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
## 2020-10-31 ENCOUNTER — Ambulatory Visit: Payer: BC Managed Care – PPO | Admitting: Psychology

## 2020-11-01 ENCOUNTER — Other Ambulatory Visit: Payer: Self-pay

## 2020-11-01 ENCOUNTER — Encounter (HOSPITAL_BASED_OUTPATIENT_CLINIC_OR_DEPARTMENT_OTHER): Payer: BC Managed Care – PPO | Admitting: Psychology

## 2020-11-01 ENCOUNTER — Encounter: Payer: Self-pay | Admitting: Psychology

## 2020-11-01 DIAGNOSIS — F0781 Postconcussional syndrome: Secondary | ICD-10-CM | POA: Diagnosis not present

## 2020-11-01 DIAGNOSIS — F4323 Adjustment disorder with mixed anxiety and depressed mood: Secondary | ICD-10-CM
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
## 2020-11-16 ENCOUNTER — Other Ambulatory Visit: Payer: Self-pay

## 2020-11-16 ENCOUNTER — Encounter (HOSPITAL_BASED_OUTPATIENT_CLINIC_OR_DEPARTMENT_OTHER): Payer: BC Managed Care – PPO | Admitting: Psychology

## 2020-11-16 DIAGNOSIS — F0781 Postconcussional syndrome: Secondary | ICD-10-CM | POA: Diagnosis not present

## 2020-11-16 DIAGNOSIS — F4323 Adjustment disorder with mixed anxiety and depressed mood: Secondary | ICD-10-CM

## 2020-11-27 ENCOUNTER — Encounter: Payer: Self-pay | Admitting: Psychology

## 2020-11-27 NOTE — Progress Notes (Signed)
   HEALTH BEHAVIORAL INTERVENTION   PROGRESS NOTE    Name: [name]  HWE:[id number] DOS:   11/16/2020 Visit #  6 Time: 60 min.   Subjective:    Patient ID: [name] is a 55 y.o. female.  Chief Complaint:   HPI- See initial consultation note dated 09/05/20 in EMR   The following portions of the patient's history were reviewed and updated as appropriate: allergies, current medications, past family history, past medical history, past social history, past surgical history and problem list. 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 Describes first week as "unbelievable" and positive, with the exception of waking-up earlier due to longer commute. Mentioned that her son (diagnosed with autism and intellectual disability) was also accepted to this school, which helps reduce some stress. 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   Objective:  Physical Exam Psychiatric:        Attention and Perception: She is inattentive. She does not perceive auditory or visual hallucinations.        Mood and Affect: Mood is anxious. Mood is not depressed or elated. Affect is tearful. Affect is not labile, blunt, flat, angry or inappropriate.        Speech: She is communicative. Speech is tangential. Speech is not rapid and pressured, delayed or slurred.        Behavior: Behavior is agitated (mild). Behavior is not slowed, aggressive, withdrawn, hyperactive or combative. Behavior is cooperative.        Thought Content: Thought content is not paranoid or delusional. Thought content does not include homicidal or suicidal ideation. Thought content does not include homicidal or suicidal plan.        Cognition and Memory: Cognition and memory normal.        Judgment: Judgment normal.   Lab Review:  not applicable  Assessment:    Postconcussional syndrome  Adjustment disorder with mixed anxiety and depressed mood   Intervention: CBT and mindfulness based stress reduction   Participation: Alert and active   Response/Effectiveness: Good and appropriate   Progress Towards Meeting Goals: Good; improving   Therapy Goal(s): Promote functional improvement, minimize psychological and/or psychosocial barriers to recovery, and aid with management of and improved coping with medical conditions.    Therapists Response:  Homework:Practice diaphragmatic/deep breathing exercisesand PMR for at least 10-31minutes daily. Review cognitive distortions. Log daily worries and predictions.    Time spent with patient: 60 min.  Plan:   Next Scheduled appointment: 11/30/20   Continue biweekly health behavioral intervention to aid with transition back to work and enhancing coping skills to manage stress. We are currently utilizing cognitive restructuring and mindfulness skills to reinforce a realistic and present focus rather than preoccupation with future (or past), worry-based planning, and/or negative problem orientation.   Billing/Service Summary:  [phone] (Health behavior intervention, individual, face-to-face; initial 30 minutes) x1 [phone] (Health behavior intervention, individual, face-to-face; each additional 15 minutes) x 2

## 2020-11-30 ENCOUNTER — Encounter: Payer: BC Managed Care – PPO | Attending: Psychology | Admitting: Psychology

## 2020-11-30 ENCOUNTER — Other Ambulatory Visit: Payer: Self-pay

## 2020-11-30 DIAGNOSIS — F0781 Postconcussional syndrome: Secondary | ICD-10-CM | POA: Diagnosis not present

## 2020-11-30 DIAGNOSIS — F4323 Adjustment disorder with mixed anxiety and depressed mood: Secondary | ICD-10-CM | POA: Diagnosis not present

## 2020-12-11 ENCOUNTER — Encounter: Payer: Self-pay | Admitting: Psychology
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
## 2020-12-14 ENCOUNTER — Encounter (HOSPITAL_BASED_OUTPATIENT_CLINIC_OR_DEPARTMENT_OTHER): Payer: BC Managed Care – PPO | Admitting: Psychology

## 2020-12-14 ENCOUNTER — Other Ambulatory Visit: Payer: Self-pay

## 2020-12-14 DIAGNOSIS — F4323 Adjustment disorder with mixed anxiety and depressed mood: Secondary | ICD-10-CM | POA: Diagnosis not present

## 2020-12-14 DIAGNOSIS — F0781 Postconcussional syndrome: Secondary | ICD-10-CM

## 2020-12-15 ENCOUNTER — Encounter: Payer: Self-pay | Admitting: Psychology
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
## 2020-12-28 ENCOUNTER — Ambulatory Visit: Payer: BC Managed Care – PPO | Admitting: Psychology

## 2021-01-03 ENCOUNTER — Ambulatory Visit (HOSPITAL_COMMUNITY)
Admission: EM | Admit: 2021-01-03 | Discharge: 2021-01-03 | Disposition: A | Discharge status: Home / Self Care | Payer: BC Managed Care – PPO

## 2021-01-03 ENCOUNTER — Other Ambulatory Visit: Payer: Self-pay

## 2021-01-03 ENCOUNTER — Encounter (HOSPITAL_COMMUNITY): Payer: Self-pay | Admitting: *Deleted

## 2021-01-03 DIAGNOSIS — I10 Essential (primary) hypertension: Secondary | ICD-10-CM

## 2021-01-03 DIAGNOSIS — R519 Headache, unspecified: Secondary | ICD-10-CM

## 2021-01-03 NOTE — ED Triage Notes (Signed)
Pt reports she was at work and she could not see clearly . Pt reported a dull ache  on Lt side of her head . The vision and head pain have resolved now . Pt A/O x4

## 2021-01-03 NOTE — Discharge Instructions (Addendum)
Please use Tylenol at a dose of 500mg -650mg  once every 6 hours as needed for your aches, pains, fevers.   For diabetes or elevated blood sugar, please make sure you are limiting and avoiding starchy, carbohydrate foods like pasta, breads, sweet breads, pastry, rice, potatoes, desserts. These foods can elevate your blood sugar. Also, limit and avoid drinks that contain a lot of sugar such as sodas, sweet teas, fruit juices.  Drinking plain water will be much more helpful, try 64 ounces of water daily.  It is okay to flavor your water naturally by cutting cucumber, lemon, mint or lime, placing it in a picture with water and drinking it over a period of 24-48 hours as long as it remains refrigerated.  For elevated blood pressure, make sure you are monitoring salt in your diet.  Do not eat restaurant foods and limit processed foods at home. I highly recommend you prepare and cook your own foods at home.  Processed foods include things like frozen meals, pre-seasoned meats and dinners, deli meats, canned foods as these foods contain a high amount of sodium/salt.  Make sure you are paying attention to sodium labels on foods you buy at the grocery store. Buy your spices separately such as garlic powder, onion powder, cumin, cayenne, parsley flakes so that you can avoid seasonings that contain salt. However, salt-free seasonings are available and can be used, an example is Mrs. Dash and includes a lot of different mixtures that do not contain salt.  Lastly, when cooking using oils that are healthier for you is important. This includes olive oil, avocado oil, canola oil. We have discussed a lot of foods to avoid but below is a list of foods that can be very healthy to use in your diet whether it is for diabetes, cholesterol, high blood pressure, or in general healthy eating.  Salads - kale, spinach, cabbage, spring mix, arugula Fruits - avocadoes, berries (blueberries, raspberries, blackberries), apples, oranges,  pomegranate, grapefruit, kiwi Vegetables - asparagus, cauliflower, broccoli, green beans, brussel sprouts, bell peppers, beets; stay away from or limit starchy vegetables like potatoes, carrots, peas Other general foods - kidney beans, egg whites, almonds, walnuts, sunflower seeds, pumpkin seeds, fat free yogurt, almond milk, flax seeds, quinoa, oats  Meat - It is better to eat lean meats and limit your red meat including pork to once a week.  Wild caught fish, chicken breast are good options as they tend to be leaner sources of good protein. Still be mindful of the sodium labels for the meats you buy.  DO NOT EAT ANY FOODS ON THIS LIST THAT YOU ARE ALLERGIC TO. For more specific needs, I highly recommend consulting a dietician or nutritionist but this can definitely be a good starting point.

## 2021-01-03 NOTE — ED Provider Notes (Signed)
[place]   MRN: [id number] DOB: [birth year]  Subjective:   [name] is a 55 y.o. female presenting for check to make sure that she is not having a stroke.  Patient states that she was at work today and felt like she had blurred vision and then started having a left-sided headache over the frontal to the temporal side.  Started to feel a little bit of tingling on the same side as well.  Symptoms resolved with the use of low-dose aspirin.  She is wants to make sure that she gets evaluated.  Denies ongoing headache, confusion, vision change, photophobia, history of migraines, weakness, numbness or tingling, chest pain, belly pain, hematuria.  Patient did just get back to work after having a prolonged leave for a concussion.  She is also undergoing a lot of stress as she is still undergoing divorce proceedings with her husband.  She also takes care of her mother who is very ill and has a child with Down syndrome that she had at age 39.  She has an adult daughter that she is struggling with as well given that she is going to college and has had a combative and argumentative relationship with her.  Admits that she is not eating healthily, not hydrating well.  Also believes that she is undergoing menopause as she has had irregular cycles this year.  No current facility-administered medications for this encounter.  Current Outpatient Medications:  .  amLODipine (NORVASC) 10 MG tablet, Take 10 mg by mouth daily., Disp: , Rfl:  .  cyclobenzaprine (FLEXERIL) 5 MG tablet, Take 1 tablet (5 mg total) by mouth 3 (three) times daily as needed for muscle spasms., Disp: 30 tablet, Rfl: 0 .  escitalopram (LEXAPRO) 20 MG tablet, Take 1 tablet (20 mg total) by mouth daily., Disp: 90 tablet, Rfl: 1 .  valsartan-hydrochlorothiazide (DIOVAN-HCT) 80-12.5 MG tablet, Take 1 tablet by mouth daily., Disp: , Rfl: 0   No Known Allergies  Past Medical History:  Diagnosis Date  . CIN I (cervical  intraepithelial neoplasia I)   . Elevated cholesterol   . Fibroid   . Hypertension   . Ileus, postoperative ([place]) 04/29/2012  . Ovarian cyst   . Postpartum care following cesarean delivery (8/2) 04/29/2012  . Thrombocytopenia ([place]) 04/29/2012  . Umbilical hernia      Past Surgical History:  Procedure Laterality Date  . ABDOMINAL EXPLORATION SURGERY     with lysis of adhesions (2013 with removal of fibroid and complicated by bladder injury)  . CESAREAN SECTION     X4  . COLPOSCOPY    . CYSTOSCOPY  04/25/2012   Procedure: CYSTOSCOPY;  Surgeon: Marvene Staff, MD;  Location: [facility];  Service: Gynecology;  Laterality: N/A;  . Intestinal blockage    . MYOMECTOMY  04/25/2012   Procedure: MYOMECTOMY;  Surgeon: Marvene Staff, MD;  Location: [facility];  Service: Gynecology;  Laterality: N/A;    Family History  Problem Relation Age of Onset  . Hypertension Father   . Stroke Father   . Hypertension Mother   . Diabetes Maternal Grandmother   . Heart disease Paternal Grandfather     Social History   Tobacco Use  . Smoking status: Never Smoker  . Smokeless tobacco: Never Used  Substance Use Topics  . Alcohol use: No  . Drug use: No    ROS   Objective:   Vitals: BP 140/80 (BP Location: Right Arm)   Pulse (!) 54  Temp 98.2 F (36.8 C) (Oral)   Resp 18   LMP 01/01/2021 Comment: irregular  SpO2 99%   Physical Exam Constitutional:      General: She is not in acute distress.    Appearance: Normal appearance. She is well-developed. She is not ill-appearing, toxic-appearing or diaphoretic.  HENT:     Head: Normocephalic and atraumatic.     Right Ear: External ear normal.     Left Ear: External ear normal.     Nose: Nose normal.     Mouth/Throat:     Mouth: Mucous membranes are moist.  Eyes:     General: No scleral icterus.       Right eye: No discharge.        Left eye: No discharge.     Extraocular Movements: Extraocular movements intact.     Conjunctiva/sclera:  Conjunctivae normal.     Pupils: Pupils are equal, round, and reactive to light.  Cardiovascular:     Rate and Rhythm: Normal rate and regular rhythm.     Pulses: Normal pulses.     Heart sounds: Normal heart sounds. No murmur heard. No friction rub. No gallop.   Pulmonary:     Effort: Pulmonary effort is normal. No respiratory distress.     Breath sounds: Normal breath sounds. No stridor. No wheezing, rhonchi or rales.  Skin:    General: Skin is warm and dry.     Findings: No rash.  Neurological:     Mental Status: She is alert and oriented to person, place, and time.     Cranial Nerves: No cranial nerve deficit.     Motor: No weakness.     Coordination: Coordination normal.     Gait: Gait normal.     Deep Tendon Reflexes: Reflexes normal.     Comments: Negative Romberg and pronator drift.  Strength 5/5 for upper and lower extremities.  Full range of motion throughout.  Psychiatric:        Mood and Affect: Mood normal.        Behavior: Behavior normal.        Thought Content: Thought content normal.        Judgment: Judgment normal.     Assessment and Plan :   PDMP not reviewed this encounter.  1. Left-sided headache   2. Essential hypertension     Patient is undergoing a lot of stress as listed above.  Suspect that she had a tension type headache.  She has a normal neurologic and cardiopulmonary exam.  Reassured patient that I do not have a reason to send her to the emergency room for rule out of an intracranial process.  Moreover, she is asymptomatic now.  Reviewed strict ER precautions.  I also encouraged patient to practice consistent self-care including healthy diet, limiting her stressors including with her family wherever possible.  Follow-up with PCP as soon as possible, also recommended recheck with her gynecologist.  Recommended Tylenol for general supportive care.  Counseled patient on potential for adverse effects with medications prescribed/recommended today, ER and  return-to-clinic precautions discussed, patient verbalized understanding.    Jaynee Eagles, PA-C 01/03/21 1801

## 2021-01-11 ENCOUNTER — Ambulatory Visit: Payer: BC Managed Care – PPO | Admitting: Psychology

## 2021-01-25 ENCOUNTER — Ambulatory Visit: Payer: BC Managed Care – PPO | Admitting: Psychology

## 2021-02-08 ENCOUNTER — Ambulatory Visit: Payer: BC Managed Care – PPO | Admitting: Psychology

## 2021-02-22 ENCOUNTER — Ambulatory Visit: Payer: BC Managed Care – PPO | Admitting: Psychology

## 2021-03-08 ENCOUNTER — Ambulatory Visit: Payer: BC Managed Care – PPO | Admitting: Psychology

## 2021-03-09 ENCOUNTER — Emergency Department (HOSPITAL_COMMUNITY)
Admission: EM | Admit: 2021-03-09 | Discharge: 2021-03-09 | Disposition: A | Discharge status: Home / Self Care | Payer: BC Managed Care – PPO | Attending: Emergency Medicine | Admitting: Emergency Medicine

## 2021-03-09 ENCOUNTER — Encounter (HOSPITAL_COMMUNITY): Payer: Self-pay

## 2021-03-09 ENCOUNTER — Emergency Department (HOSPITAL_COMMUNITY): Payer: BC Managed Care – PPO

## 2021-03-09 DIAGNOSIS — M62830 Muscle spasm of back: Secondary | ICD-10-CM | POA: Diagnosis not present

## 2021-03-09 DIAGNOSIS — M549 Dorsalgia, unspecified: Secondary | ICD-10-CM | POA: Diagnosis present

## 2021-03-09 DIAGNOSIS — R109 Unspecified abdominal pain: Secondary | ICD-10-CM | POA: Diagnosis not present

## 2021-03-09 DIAGNOSIS — Z8541 Personal history of malignant neoplasm of cervix uteri: Secondary | ICD-10-CM | POA: Diagnosis not present

## 2021-03-09 DIAGNOSIS — I10 Essential (primary) hypertension: Secondary | ICD-10-CM | POA: Diagnosis not present

## 2021-03-09 DIAGNOSIS — Z79899 Other long term (current) drug therapy: Secondary | ICD-10-CM | POA: Diagnosis not present

## 2021-03-09 LAB — CBC WITH DIFFERENTIAL/PLATELET
Abs Immature Granulocytes: 0.01 10*3/uL (ref 0.00–0.07)
Basophils Absolute: 0 10*3/uL (ref 0.0–0.1)
Basophils Relative: 0 %
Eosinophils Absolute: 0 10*3/uL (ref 0.0–0.5)
Eosinophils Relative: 0 %
HCT: 43.9 % (ref 36.0–46.0)
Hemoglobin: 13.9 g/dL (ref 12.0–15.0)
Immature Granulocytes: 0 %
Lymphocytes Relative: 32 %
Lymphs Abs: 1.5 10*3/uL (ref 0.7–4.0)
MCH: 27.3 pg (ref 26.0–34.0)
MCHC: 31.7 g/dL (ref 30.0–36.0)
MCV: 86.2 fL (ref 80.0–100.0)
Monocytes Absolute: 0.4 10*3/uL (ref 0.1–1.0)
Monocytes Relative: 8 %
Neutro Abs: 2.9 10*3/uL (ref 1.7–7.7)
Neutrophils Relative %: 60 %
Platelets: 341 10*3/uL (ref 150–400)
RBC: 5.09 MIL/uL (ref 3.87–5.11)
RDW: 14 % (ref 11.5–15.5)
WBC: 4.9 10*3/uL (ref 4.0–10.5)
nRBC: 0 % (ref 0.0–0.2)

## 2021-03-09 LAB — URINALYSIS, ROUTINE W REFLEX MICROSCOPIC
Bilirubin Urine: NEGATIVE
Glucose, UA: NEGATIVE mg/dL
Hgb urine dipstick: NEGATIVE
Ketones, ur: NEGATIVE mg/dL
Leukocytes,Ua: NEGATIVE
Nitrite: NEGATIVE
Protein, ur: NEGATIVE mg/dL
Specific Gravity, Urine: 1.016 (ref 1.005–1.030)
pH: 5 (ref 5.0–8.0)

## 2021-03-09 LAB — COMPREHENSIVE METABOLIC PANEL
ALT: 15 U/L (ref 0–44)
AST: 19 U/L (ref 15–41)
Albumin: 4 g/dL (ref 3.5–5.0)
Alkaline Phosphatase: 67 U/L (ref 38–126)
Anion gap: 8 (ref 5–15)
BUN: 11 mg/dL (ref 6–20)
CO2: 27 mmol/L (ref 22–32)
Calcium: 9.3 mg/dL (ref 8.9–10.3)
Chloride: 101 mmol/L (ref 98–111)
Creatinine, Ser: 0.63 mg/dL (ref 0.44–1.00)
GFR, Estimated: >60 mL/min (ref >60)
Glucose, Bld: 108 mg/dL — ABNORMAL HIGH (ref 70–99)
Potassium: 3.7 mmol/L (ref 3.5–5.1)
Sodium: 136 mmol/L (ref 135–145)
Total Bilirubin: 0.5 mg/dL (ref 0.3–1.2)
Total Protein: 7.4 g/dL (ref 6.5–8.1)

## 2021-03-09 LAB — I-STAT BETA HCG BLOOD, ED (MC, WL, AP ONLY): I-stat hCG, quantitative: <5 m[IU]/mL (ref <5)

## 2021-03-09 MED ORDER — KETOROLAC TROMETHAMINE 60 MG/2ML IM SOLN: 60.0000 mg | Freq: Once | INTRAMUSCULAR | Status: DC

## 2021-03-09 MED ORDER — KETOROLAC TROMETHAMINE 15 MG/ML IJ SOLN
15.0000 mg | Freq: Once | INTRAMUSCULAR | Status: AC
  Administered 2021-03-09: 09:00:00 15 mg via INTRAVENOUS
  Filled 2021-03-09: qty 1

## 2021-03-09 MED ORDER — DIAZEPAM 5 MG PO TABS: 5.0000 mg | ORAL_TABLET | Freq: Three times a day (TID) | ORAL | 0 refills | Status: DC | PRN

## 2021-03-09 MED ORDER — DIAZEPAM 5 MG PO TABS
10.0000 mg | ORAL_TABLET | Freq: Once | ORAL | Status: AC
  Administered 2021-03-09: 09:00:00 10 mg via ORAL
  Filled 2021-03-09: qty 2

## 2021-03-09 NOTE — ED Triage Notes (Signed)
Pt states that she has been having intermittent back pain/ flank pain for the past several days, worse tonight, denies urinary symptoms , denies injury

## 2021-03-09 NOTE — ED Provider Notes (Signed)
Emergency Medicine Provider Triage Evaluation Note  [name] , a 55 y.o. female  was evaluated in triage.  Pt complains of right sided back spasms for the past 2 days.  States it feels like a squeezing pain in her back.  States her power steering is out in her car and she has been turning the wheel hard but denies any fall or direct trauma.  Denies dysuria, fever.  Review of Systems  Positive: Back spasms Negative: Dysuria, fever, chills  Physical Exam  BP (!) 147/90 (BP Location: Left Arm)   Pulse (!) 109   Temp 98.7 F (37.1 C) (Oral)   Resp 17   SpO2 100%   Gen:   Awake, no distress, intermittently yelling during triage Resp:  Normal effort  MSK:   Unwilling to attempt to move out of wheelchair but moving extremities without difficulty in triage   Medical Decision Making  Medically screening exam initiated at 3:40 AM.  Appropriate orders placed.  [name] was informed that the remainder of the evaluation will be completed by another provider, this initial triage assessment does not replace that evaluation, and the importance of remaining in the ED until their evaluation is complete.   Larene Pickett, PA-C 03/09/21 0438    Tegeler, Gwenyth Allegra, MD 03/09/21 [phone]

## 2021-03-09 NOTE — ED Provider Notes (Signed)
[facility] EMERGENCY DEPARTMENT Provider Note   CSN: [id number] Arrival date & time: 03/09/21  2992     History Chief Complaint  Patient presents with   Flank Pain   Back Pain    [name] is a 55 y.o. female.  Patient with history of high blood pressure, fibroid, thrombocytopenia presents with right flank and back pain worsening the past few days.  Patient has no direct trauma or injury however she has been using more strength to drive the vehicle as the power steering broke.  Patient says this is worse than any muscle spasm or muscle injury she has had before.  Patient cannot get comfortable, cannot sleep.  No neurologic signs or symptoms.  No anterior abdominal pain.  No blood in the urine, no urinary symptoms.  Patient denies fevers chills or cough.      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, antepartum 04/17/2012   Elderly multigravida with antepartum condition or complication 19/62/2297   CIN I (cervical intraepithelial neoplasia I)    Fibroid     Past Surgical History:  Procedure Laterality Date   ABDOMINAL EXPLORATION SURGERY     with lysis of adhesions (2013 with removal of fibroid and complicated by bladder injury)   CESAREAN SECTION     X4   COLPOSCOPY     CYSTOSCOPY  04/25/2012   Procedure: CYSTOSCOPY;  Surgeon: Marvene Staff, MD;  Location: [facility];  Service: Gynecology;  Laterality: N/A;   Intestinal blockage     MYOMECTOMY  04/25/2012    Procedure: MYOMECTOMY;  Surgeon: Marvene Staff, MD;  Location: [facility];  Service: Gynecology;  Laterality: N/A;     OB History     Gravida  6   Para  3   Term  2   Preterm  1   AB  3   Living  3      SAB  2   IAB  1   Ectopic      Multiple      Live Births  3           Family History  Problem Relation Age of Onset   Hypertension Father    Stroke Father    Hypertension Mother    Diabetes Maternal Grandmother    Heart disease Paternal Grandfather     Social History   Tobacco Use   Smoking status: Never   Smokeless tobacco: Never  Substance Use Topics   Alcohol use: No   Drug use: No    Home Medications Prior to Admission medications   Medication Sig Start Date End Date Taking? Authorizing Provider  diazepam (VALIUM) 5 MG tablet Take 1-2 tablets (5-10 mg total) by mouth every 8 (eight) hours as needed for muscle spasms. 03/09/21  Yes Elnora Morrison, MD  amLODipine (NORVASC) 10 MG tablet Take 10 mg by mouth daily.    [provider]  cyclobenzaprine (FLEXERIL) 5 MG tablet Take 1 tablet (  5 mg total) by mouth 3 (three) times daily as needed for muscle spasms. 06/14/20   Rosemarie Ax, MD  escitalopram (LEXAPRO) 20 MG tablet Take 1 tablet (20 mg total) by mouth daily. 08/10/20   Dagoberto Ligas, MD  valsartan-hydrochlorothiazide (DIOVAN-HCT) 80-12.5 MG tablet Take 1 tablet by mouth daily. 06/01/15   [provider]    Allergies    Patient has no known allergies.  Review of Systems   Review of Systems  Constitutional:  Negative for chills and fever.  HENT:  Negative for congestion.   Eyes:  Negative for visual disturbance.  Respiratory:  Negative for shortness of breath.   Cardiovascular:  Negative for chest pain.  Gastrointestinal:  Positive for nausea. Negative for abdominal pain and vomiting.  Genitourinary:  Positive for flank pain. Negative for dysuria.  Musculoskeletal:  Positive for back pain. Negative for neck pain  and neck stiffness.  Skin:  Negative for rash.  Neurological:  Positive for dizziness. Negative for light-headedness and headaches.   Physical Exam Updated Vital Signs BP 127/75   Pulse 62   Temp 98.5 F (36.9 C)   Resp (!) 22   SpO2 100%   Physical Exam Vitals and nursing note reviewed.  Constitutional:      General: She is not in acute distress.    Appearance: She is well-developed.  HENT:     Head: Normocephalic and atraumatic.     Mouth/Throat:     Mouth: Mucous membranes are dry.  Eyes:     General:        Right eye: No discharge.        Left eye: No discharge.     Conjunctiva/sclera: Conjunctivae normal.  Neck:     Trachea: No tracheal deviation.  Cardiovascular:     Rate and Rhythm: Normal rate and regular rhythm.     Heart sounds: No murmur heard. Pulmonary:     Effort: Pulmonary effort is normal.     Breath sounds: Normal breath sounds.  Abdominal:     General: There is no distension.     Palpations: Abdomen is soft.     Tenderness: There is no abdominal tenderness. There is no guarding.  Musculoskeletal:        General: Tenderness present. No swelling. Normal range of motion.     Cervical back: Normal range of motion and neck supple. No rigidity.     Comments: Patient uncomfortable on exam, tight musculature right paraspinal mid thoracic and tenderness palpation right posterior flank.  No midline tenderness thoracic or lumbar region.  Patient has lipoma right upper thoracic region nontender.  Skin:    General: Skin is warm.     Capillary Refill: Capillary refill takes less than 2 seconds.     Findings: No rash.  Neurological:     General: No focal deficit present.     Mental Status: She is alert.     Cranial Nerves: No cranial nerve deficit.  Psychiatric:        Mood and Affect: Mood normal.    ED Results / Procedures / Treatments   Labs (all labs ordered are listed, but only abnormal results are displayed) Labs Reviewed  URINALYSIS, ROUTINE W  REFLEX MICROSCOPIC - Abnormal; Notable for the following components:      Result Value   APPearance CLOUDY (*)    All other components within normal limits  COMPREHENSIVE METABOLIC PANEL - Abnormal; Notable for the following components:   Glucose, Bld 108 (*)  All other components within normal limits  CBC WITH DIFFERENTIAL/PLATELET  I-STAT BETA HCG BLOOD, ED ([facility], [facility], [facility] ONLY)    EKG None  Radiology No results found. CT Renal Stone Study  Result Date: 03/09/2021 CLINICAL DATA:  RIGHT flank pain for 2 days suspected kidney stone EXAM: CT ABDOMEN AND PELVIS WITHOUT CONTRAST TECHNIQUE: Multidetector CT imaging of the abdomen and pelvis was performed following the standard protocol without IV contrast. Sagittal and coronal MPR images reconstructed from axial data set. No oral contrast administered. COMPARISON:  09/10/2019 FINDINGS: Lower chest: Dependent subsegmental atelectasis BILATERAL lower lobes. Hepatobiliary: Single calcification within liver question granuloma. Gallbladder and liver otherwise normal appearance. Pancreas: Normal appearance Spleen: Normal appearance Adrenals/Urinary Tract: Adrenal glands, kidneys, ureters, and bladder normal appearance. No definite urinary tract calcification or dilatation. No renal masses identified. Bladder unremarkable. Stomach/Bowel: Medication tablets within cecum. Normal appendix, retrocecal. Ventral hernia versus diastasis recti containing bowel loops. Stomach and bowel loops otherwise normal appearance. No evidence of obstruction or bowel wall thickening. Vascular/Lymphatic: Aorta normal caliber.  No adenopathy. Reproductive: Unremarkable uterus and adnexa Other: No free air or free fluid.  No acute inflammatory process. Musculoskeletal: Osseous structures unremarkable. IMPRESSION: Ventral hernia versus diastasis recti containing bowel loops. No acute intra-abdominal or intrapelvic abnormalities. Specifically, no evidence of urinary tract calcification  or dilatation. Electronically Signed   By: Lavonia Dana M.D.   On: 03/09/2021 11:10    Procedures Procedures   Medications Ordered in ED Medications  diazepam (VALIUM) tablet 10 mg (10 mg Oral Given 03/09/21 0838)  ketorolac (TORADOL) 15 MG/ML injection 15 mg (15 mg Intravenous Given 03/09/21 3817)    ED Course  I have reviewed the triage vital signs and the nursing notes.  Pertinent labs & imaging results that were available during my care of the patient were reviewed by me and considered in my medical decision making (see chart for details).    MDM Rules/Calculators/A&P                           Final Clinical Impression(s) / ED Diagnoses Final diagnoses:  Muscle spasm of back  Acute right flank pain   Patient presents with clinical concern for significant muscle spasm/musculoskeletal pathology however with pain out of proportion and different than previous discussed plan for blood work, CT scan look for any kidney stone or other pathology in the area.  Toradol and Valium improved pain significantly on reassessment.  CT scan pending.  Basic blood work showed normal hemoglobin, normal white blood cell count, kidney function and liver function unremarkable.  Patient's pain resolved with Valium and Toradol.  CT scan reviewed no acute abnormalities, hernia seen. Patient stable for outpatient follow-up.  Rx / DC Orders ED Discharge Orders          Ordered    diazepam (VALIUM) 5 MG tablet  Every 8 hours PRN        03/09/21 1009             Elnora Morrison, MD 03/13/21 2315

## 2021-03-09 NOTE — Discharge Instructions (Addendum)
Your blood work was normal and your CT scan did not show any signs of infection or kidney stone.  Use Tylenol 1000 mg every 4 hours as needed and ibuprofen 600 mg every 6 hours as needed for pain. Use heat packs as needed and for significant muscle spasm you can take Valium however it can make you sleepy so no driving or operating machinery. Return for new or worsening concerns.

## 2022-01-10 ENCOUNTER — Ambulatory Visit: Payer: BC Managed Care – PPO | Admitting: Family Medicine

## 2022-01-17 ENCOUNTER — Encounter: Payer: Self-pay | Admitting: Family Medicine

## 2022-01-17 ENCOUNTER — Ambulatory Visit (INDEPENDENT_AMBULATORY_CARE_PROVIDER_SITE_OTHER): Payer: BC Managed Care – PPO | Admitting: Family Medicine

## 2022-01-17 VITALS — BP 116/77 | Ht 61.0 in | Wt 165.0 lb

## 2022-01-17 DIAGNOSIS — I498 Other specified cardiac arrhythmias: Secondary | ICD-10-CM | POA: Diagnosis not present

## 2022-01-17 NOTE — Progress Notes (Signed)
PCP: Christa See, FNP ? ?Subjective:  ? ?HPI: ?Patient is a 56 y.o. female here for dizziness. ? ?Patient was seen here previously over a year ago for concussion that resolved. ?She reports she's had episodes of dizziness intermittently since March. 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  She felt dizziness, couldn't catch her breath, palpitations, headache. ?This first episode took 20 minutes to resolve. ?Another severe episode occurred in [place] - sudden occurrence of dizziness, fatigue, feeling woozy that resolved in a few minutes.  No palpations, shortness of breath with this. ?She's had several other more minor episodes where she feels her heart pounding and dizzy. ?Has not seen cardiologist in past or had workup for this yet. ? ?Past Medical History:  ?Diagnosis Date  ? CIN I (cervical intraepithelial neoplasia I)   ? Elevated cholesterol   ? Fibroid   ? Hypertension   ? Ileus, postoperative ([place]) 04/29/2012  ? Ovarian cyst   ? Postpartum care following cesarean delivery (8/2) 04/29/2012  ? Thrombocytopenia ([place]) 04/29/2012  ? Umbilical hernia   ? ? ?Current Outpatient Medications on File Prior to Visit  ?Medication Sig Dispense Refill  ? amLODipine (NORVASC) 10 MG tablet Take 10 mg by mouth daily.    ? cyclobenzaprine (FLEXERIL) 5 MG tablet Take 1 tablet (5 mg total) by mouth 3 (three) times daily as needed for muscle spasms. 30 tablet 0  ? diazepam (VALIUM) 5 MG tablet Take 1-2 tablets (5-10 mg total) by mouth every 8 (eight) hours as needed for muscle spasms. 10 tablet 0  ? escitalopram (LEXAPRO) 20 MG tablet Take 1 tablet (20 mg total) by mouth daily. 90 tablet 1  ? valsartan-hydrochlorothiazide (DIOVAN-HCT) 80-12.5 MG tablet Take 1 tablet by mouth daily.  0  ? ?No current facility-administered medications on file prior to visit.  ? ? ?Past Surgical History:  ?Procedure Laterality Date  ? ABDOMINAL EXPLORATION SURGERY    ? with lysis  of adhesions (2013 with removal of fibroid and complicated by bladder injury)  ? CESAREAN SECTION    ? X4  ? COLPOSCOPY    ? CYSTOSCOPY  04/25/2012  ? Procedure: CYSTOSCOPY;  Surgeon: Marvene Staff, MD;  Location: [facility];  Service: Gynecology;  Laterality: N/A;  ? Intestinal blockage    ? MYOMECTOMY  04/25/2012  ? Procedure: MYOMECTOMY;  Surgeon: Marvene Staff, MD;  Location: [facility];  Service: Gynecology;  Laterality: N/A;  ? ? ?No Known Allergies ? ?BP 116/77   Ht '5\' 1"'$  (1.549 m)   Wt 165 lb (74.8 kg)   BMI 31.18 kg/m?  ? ? ?  06/17/2020  ? 10:33 AM 07/20/2020  ?  2:00 PM 08/10/2020  ?  3:03 PM  ?[place] Adult Exercise  ?Frequency of aerobic exercise (# of days/week) 0 0 0  ?Average time in minutes 0 0 0  ?Frequency of strengthening activities (# of days/week) 0 0 0  ? ? ?   ? View : No data to display.  ?  ?  ?  ? ? ?    ?Objective:  ?Physical Exam: ? ?Gen: NAD, comfortable in exam room ? ?Lungs: CTAB without wheezes, rales, rhonchi. ?CV: RRR no MRG currently. ?  ?Assessment & Plan:  ?1. Palpitations - with dizziness, shortness of breath intermittently past 1-2 months.  Currently asymptomatic.  Concerning for arrhythmia - possible SVT or afib.  Will refer to cardiology  for evaluation and workup. ?

## 2022-01-17 NOTE — Patient Instructions (Signed)
We will refer you to cardiology. ?Contact me or follow-up with me after they complete their workup. ?

## 2022-01-22 ENCOUNTER — Encounter: Payer: Self-pay | Admitting: Internal Medicine

## 2022-01-22 ENCOUNTER — Ambulatory Visit (INDEPENDENT_AMBULATORY_CARE_PROVIDER_SITE_OTHER): Payer: BC Managed Care – PPO | Admitting: Internal Medicine

## 2022-01-22 VITALS — BP 122/68 | HR 70 | Ht 61.0 in | Wt 164.8 lb

## 2022-01-22 DIAGNOSIS — R42 Dizziness and giddiness: Secondary | ICD-10-CM

## 2022-01-22 NOTE — Progress Notes (Signed)
?Cardiology Office Note:   ? ?Date:  01/22/2022  ? ?ID:  [name], DOB 07/12/1966, MRN [id number] ? ?PCP:  Christa See, FNP ?  ?[facility] Providers ?Cardiologist:  Janina Mayo, MD    ? ?Referring MD: Dene Gentry, MD  ? ?No chief complaint on file. ?Palpitations ? ?History of Present Illness:   ? ?[name] is a 56 y.o. female with a hx of HTN, for for palpitations ? ?She notes rapid heart rates was three weeks ago. She's been fasting for religious regions. She felt presyncopal. No syncope.  This was 3 weeks ago. She has dizziness and headaches. She had URI symptoms.   No cardiologist before. No stress test. Her father had strokes and passed. She has a lot of stress ? ?Seen at [facility] for possible VSD on fetal echo which was not shown. This was in 2013 ? ?Event monitor 2013 showed sinus rhythm.  ? ?Past Medical History:  ?Diagnosis Date  ? CIN I (cervical intraepithelial neoplasia I)   ? Elevated cholesterol   ? Fibroid   ? Hypertension   ? Ileus, postoperative ([place]) 04/29/2012  ? Ovarian cyst   ? Postpartum care following cesarean delivery (8/2) 04/29/2012  ? Thrombocytopenia ([place]) 04/29/2012  ? Umbilical hernia   ? ? ?Past Surgical History:  ?Procedure Laterality Date  ? ABDOMINAL EXPLORATION SURGERY    ? with lysis of adhesions (2013 with removal of fibroid and complicated by bladder injury)  ? CESAREAN SECTION    ? X4  ? COLPOSCOPY    ? CYSTOSCOPY  04/25/2012  ? Procedure: CYSTOSCOPY;  Surgeon: Marvene Staff, MD;  Location: [facility];  Service: Gynecology;  Laterality: N/A;  ? Intestinal blockage    ? MYOMECTOMY  04/25/2012  ? Procedure: MYOMECTOMY;  Surgeon: Marvene Staff, MD;  Location: [facility];  Service: Gynecology;  Laterality: N/A;  ? ? ?Current Medications: ?Current Meds  ?Medication Sig  ? amLODipine (NORVASC) 10 MG tablet Take 10 mg by mouth daily.  ? escitalopram (LEXAPRO) 20 MG tablet Take 1 tablet (20 mg total) by mouth daily.  ? valsartan-hydrochlorothiazide (DIOVAN-HCT) 80-12.5 MG  tablet Take 1 tablet by mouth daily.  ?  ? ?Allergies:   Patient has no known allergies.  ? ?Social History  ? ?Socioeconomic History  ? Marital status: Married  ?  Spouse name: Not on file  ? Number of children: Not on file  ? Years of education: Not on file  ? Highest education level: Not on file  ?Occupational History  ? Not on file  ?Tobacco Use  ? Smoking status: Never  ? Smokeless tobacco: Never  ?Substance and Sexual Activity  ? Alcohol use: No  ? Drug use: No  ? Sexual activity: Not on file  ?Other Topics Concern  ? Not on file  ?Social History Narrative  ? Not on file  ? ?Social Determinants of Health  ? ?Financial Resource Strain: Not on file  ?Food Insecurity: Not on file  ?Transportation Needs: Not on file  ?Physical Activity: Not on file  ?Stress: Not on file  ?Social Connections: Not on file  ?  ? ?Family History: ?The patient's family history includes Diabetes in her maternal grandmother; Heart disease in her paternal grandfather; Hypertension in her father and mother; Stroke in her father. ? ?ROS:   ?Please see the history of present illness.    ? All other systems reviewed and are negative. ? ?EKGs/Labs/Other Studies Reviewed:   ? ?The following  studies were reviewed today: ? ? ?EKG:  EKG is  ordered today.  The ekg ordered today demonstrates  ? ?5/1-NSR ? ?Recent Labs: ?03/09/2021: ALT 15; BUN 11; Creatinine, Ser 0.63; Hemoglobin 13.9; Platelets 341; Potassium 3.7; Sodium 136  ?Recent Lipid Panel ?No results found for: CHOL, TRIG, HDL, CHOLHDL, VLDL, LDLCALC, LDLDIRECT ? ? ?Risk Assessment/Calculations:   ?  ? ?    ? ?Physical Exam:   ? ?VS ? ?Vitals:  ? 01/22/22 1529  ?BP: 122/68  ?Pulse: 70  ?SpO2: 99%  ? ?  ? ?Wt Readings from Last 3 Encounters:  ?01/22/22 164 lb 12.8 oz (74.8 kg)  ?01/17/22 165 lb (74.8 kg)  ?09/28/20 158 lb (71.7 kg)  ?  ? ?GEN:  Well nourished, well developed in no acute distress ?HEENT: Normal ?NECK: No JVD; No carotid bruits ?LYMPHATICS: No lymphadenopathy ?CARDIAC: RRR, no  murmurs, rubs, gallops ?RESPIRATORY:  Clear to auscultation without rales, wheezing or rhonchi  ?ABDOMEN: Soft, non-tender, non-distended ?MUSCULOSKELETAL:  No edema; No deformity  ?SKIN: Warm and dry ?NEUROLOGIC:  Alert and oriented x 3 ?PSYCHIATRIC:  Normal affect  ? ?ASSESSMENT:   ? ?#Dizziness: unlikely cardiac related. Could be related to sinus infection. No foul smelling draining, no signs of bacterial infection. Low suspicion for stroke, blood pressures are in a good range. Normal rhythm today. Qtc normal. We discussed that if her palpitations persist, we can monitor them. She can reach out and let us know; otherwise no signs of cardiac dx and she can follow up as needed ? ? ?PLAN:   ? ?In order of problems listed above: ? ?Follow up as needed ? ?   ? ?   ?Medication Adjustments/Labs and Tests Ordered: ?Current medicines are reviewed at length with the patient today.  Concerns regarding medicines are outlined above.  ?Orders Placed This Encounter  ?Procedures  ? EKG 12-Lead  ? ?No orders of the defined types were placed in this encounter. ? ? ?Patient Instructions  ?Medication Instructions:  ?No Changes In Medications at this time.  ?*If you need a refill on your cardiac medications before your next appointment, please call your pharmacy* ? ?Lab Work: ?None Ordered At This Time.  ?If you have labs (blood work) drawn today and your tests are completely normal, you will receive your results only by: ?MyChart Message (if you have MyChart) OR ?A paper copy in the mail ?If you have any lab test that is abnormal or we need to change your treatment, we will call you to review the results. ? ?Testing/Procedures: ?None Ordered At This Time.  ? ?Follow-Up: ?At [facility], you and your health needs are our priority.  As part of our continuing mission to provide you with exceptional heart care, we have created designated Provider Care Teams.  These Care Teams include your primary Cardiologist (physician) and Advanced  Practice Providers (APPs -  Physician Assistants and Nurse Practitioners) who all work together to provide you with the care you need, when you need it. ? ?We recommend signing up for the patient portal called "MyChart".  Sign up information is provided on this After Visit Summary.  MyChart is used to connect with patients for Virtual Visits (Telemedicine).  Patients are able to view lab/test results, encounter notes, upcoming appointments, etc.  Non-urgent messages can be sent to your provider as well.   ?To learn more about what you can do with MyChart, go to [email or web address].   ? ?Your next appointment:   ?AS NEEDED  ? ?  The format for your next appointment:   ?In Person ? ?Provider:   ?Janina Mayo, MD   ? ? ?   ? ?Signed, ?Janina Mayo, MD  ?01/22/2022 3:59 PM    ?[place] ?

## 2022-01-22 NOTE — Patient Instructions (Signed)
Medication Instructions:  No Changes In Medications at this time.  *If you need a refill on your cardiac medications before your next appointment, please call your pharmacy*  Lab Work: None Ordered At This Time.  If you have labs (blood work) drawn today and your tests are completely normal, you will receive your results only by: MyChart Message (if you have MyChart) OR A paper copy in the mail If you have any lab test that is abnormal or we need to change your treatment, we will call you to review the results.  Testing/Procedures: None Ordered At This Time.   Follow-Up: At [facility], you and your health needs are our priority.  As part of our continuing mission to provide you with exceptional heart care, we have created designated Provider Care Teams.  These Care Teams include your primary Cardiologist (physician) and Advanced Practice Providers (APPs -  Physician Assistants and Nurse Practitioners) who all work together to provide you with the care you need, when you need it.  We recommend signing up for the patient portal called "MyChart".  Sign up information is provided on this After Visit Summary.  MyChart is used to connect with patients for Virtual Visits (Telemedicine).  Patients are able to view lab/test results, encounter notes, upcoming appointments, etc.  Non-urgent messages can be sent to your provider as well.   To learn more about what you can do with MyChart, go to [email or web address].    Your next appointment:   AS NEEDED   The format for your next appointment:   In Person  Provider:   Branch, Mary E, MD         

## 2023-01-11 ENCOUNTER — Other Ambulatory Visit: Payer: Self-pay

## 2023-01-11 ENCOUNTER — Emergency Department (HOSPITAL_COMMUNITY)
Admission: EM | Admit: 2023-01-11 | Discharge: 2023-01-12 | Disposition: A | Discharge status: Home / Self Care | Payer: BC Managed Care – PPO | Attending: Emergency Medicine | Admitting: Emergency Medicine

## 2023-01-11 ENCOUNTER — Encounter (HOSPITAL_COMMUNITY): Payer: Self-pay

## 2023-01-11 ENCOUNTER — Emergency Department (HOSPITAL_COMMUNITY): Payer: BC Managed Care – PPO

## 2023-01-11 DIAGNOSIS — I10 Essential (primary) hypertension: Secondary | ICD-10-CM | POA: Diagnosis not present

## 2023-01-11 DIAGNOSIS — R1032 Left lower quadrant pain: Secondary | ICD-10-CM | POA: Diagnosis present

## 2023-01-11 DIAGNOSIS — Z79899 Other long term (current) drug therapy: Secondary | ICD-10-CM | POA: Diagnosis not present

## 2023-01-11 LAB — CBC WITH DIFFERENTIAL/PLATELET
Abs Immature Granulocytes: 0.01 10*3/uL (ref 0.00–0.07)
Basophils Absolute: 0 10*3/uL (ref 0.0–0.1)
Basophils Relative: 1 %
Eosinophils Absolute: 0.2 10*3/uL (ref 0.0–0.5)
Eosinophils Relative: 3 %
HCT: 42.5 % (ref 36.0–46.0)
Hemoglobin: 12.9 g/dL (ref 12.0–15.0)
Immature Granulocytes: 0 %
Lymphocytes Relative: 49 %
Lymphs Abs: 2.6 10*3/uL (ref 0.7–4.0)
MCH: 27.2 pg (ref 26.0–34.0)
MCHC: 30.4 g/dL (ref 30.0–36.0)
MCV: 89.5 fL (ref 80.0–100.0)
Monocytes Absolute: 0.4 10*3/uL (ref 0.1–1.0)
Monocytes Relative: 8 %
Neutro Abs: 2.1 10*3/uL (ref 1.7–7.7)
Neutrophils Relative %: 39 %
Platelets: 322 10*3/uL (ref 150–400)
RBC: 4.75 MIL/uL (ref 3.87–5.11)
RDW: 14.6 % (ref 11.5–15.5)
WBC: 5.3 10*3/uL (ref 4.0–10.5)
nRBC: 0 % (ref 0.0–0.2)

## 2023-01-11 LAB — I-STAT BETA HCG BLOOD, ED (MC, WL, AP ONLY): I-stat hCG, quantitative: <5 m[IU]/mL (ref <5)

## 2023-01-11 LAB — COMPREHENSIVE METABOLIC PANEL
ALT: 19 U/L (ref 0–44)
AST: 21 U/L (ref 15–41)
Albumin: 3.9 g/dL (ref 3.5–5.0)
Alkaline Phosphatase: 59 U/L (ref 38–126)
Anion gap: 12 (ref 5–15)
BUN: 15 mg/dL (ref 6–20)
CO2: 21 mmol/L — ABNORMAL LOW (ref 22–32)
Calcium: 9.4 mg/dL (ref 8.9–10.3)
Chloride: 103 mmol/L (ref 98–111)
Creatinine, Ser: 0.84 mg/dL (ref 0.44–1.00)
GFR, Estimated: >60 mL/min (ref >60)
Glucose, Bld: 79 mg/dL (ref 70–99)
Potassium: 3.7 mmol/L (ref 3.5–5.1)
Sodium: 136 mmol/L (ref 135–145)
Total Bilirubin: 0.4 mg/dL (ref 0.3–1.2)
Total Protein: 6.6 g/dL (ref 6.5–8.1)

## 2023-01-11 LAB — URINALYSIS, ROUTINE W REFLEX MICROSCOPIC
Bilirubin Urine: NEGATIVE
Glucose, UA: NEGATIVE mg/dL
Hgb urine dipstick: NEGATIVE
Ketones, ur: 5 mg/dL — AB
Leukocytes,Ua: NEGATIVE
Nitrite: NEGATIVE
Protein, ur: NEGATIVE mg/dL
Specific Gravity, Urine: 1.016 (ref 1.005–1.030)
pH: 6 (ref 5.0–8.0)

## 2023-01-11 LAB — LIPASE, BLOOD: Lipase: 30 U/L (ref 11–51)

## 2023-01-11 MED ORDER — HYDROCODONE-ACETAMINOPHEN 5-325 MG PO TABS: 1.0000 | ORAL_TABLET | ORAL | 0 refills | Status: AC | PRN

## 2023-01-11 MED ORDER — MORPHINE SULFATE (PF) 4 MG/ML IV SOLN
4.0000 mg | Freq: Once | INTRAVENOUS | Status: AC
  Administered 2023-01-11: 4 mg via INTRAVENOUS
  Filled 2023-01-11: qty 1

## 2023-01-11 MED ORDER — ONDANSETRON HCL 4 MG/2ML IJ SOLN
4.0000 mg | Freq: Once | INTRAMUSCULAR | Status: AC
  Administered 2023-01-11: 4 mg via INTRAVENOUS
  Filled 2023-01-11: qty 2

## 2023-01-11 MED ORDER — ONDANSETRON 4 MG PO TBDP: 4.0000 mg | ORAL_TABLET | Freq: Three times a day (TID) | ORAL | 0 refills | Status: AC | PRN

## 2023-01-11 MED ORDER — SODIUM CHLORIDE 0.9 % IV BOLUS
1000.0000 mL | Freq: Once | INTRAVENOUS | Status: AC
  Administered 2023-01-11: 1000 mL via INTRAVENOUS

## 2023-01-11 MED ORDER — IOHEXOL 350 MG/ML SOLN
75.0000 mL | Freq: Once | INTRAVENOUS | Status: AC | PRN
  Administered 2023-01-11: 75 mL via INTRAVENOUS

## 2023-01-11 NOTE — ED Notes (Signed)
Daughter Theora Master [phone] would like an update asap

## 2023-01-11 NOTE — ED Provider Notes (Signed)
[place] EMERGENCY DEPARTMENT AT [facility] Provider Note   CSN: [id number] Arrival date & time: 01/11/23  1623     History  Chief Complaint  Patient presents with   Abdominal Pain    [name] is a 57 y.o. female.  Pt is a 57 yo female with pmhx significant for hx sbo, fibroids, htn, hld, ovarian cyst.  Pt said she has been having llq abd pain since around 1530 today.  No n/v.  She is having bowel movements.  No fever.       Home Medications Prior to Admission medications   Medication Sig Start Date End Date Taking? Authorizing Provider  HYDROcodone-acetaminophen (NORCO/VICODIN) 5-325 MG tablet Take 1 tablet by mouth every 4 (four) hours as needed. 01/11/23  Yes Jacalyn Lefevre, MD  ondansetron (ZOFRAN-ODT) 4 MG disintegrating tablet Take 1 tablet (4 mg total) by mouth every 8 (eight) hours as needed for nausea or vomiting. 01/11/23  Yes Jacalyn Lefevre, MD  amLODipine (NORVASC) 10 MG tablet Take 10 mg by mouth daily.    [provider]  escitalopram (LEXAPRO) 20 MG tablet Take 1 tablet (20 mg total) by mouth daily. 08/10/20   Guy Sandifer, MD  valsartan-hydrochlorothiazide (DIOVAN-HCT) 80-12.5 MG tablet Take 1 tablet by mouth daily. 06/01/15   [provider]      Allergies    Patient has no known allergies.    Review of Systems   Review of Systems  Gastrointestinal:  Positive for abdominal pain.  All other systems reviewed and are negative.   Physical Exam Updated Vital Signs BP (!) 156/90 (BP Location: Right Arm)   Pulse (!) 55   Temp 98.6 F (37 C)   Resp 18   Ht  (1.549 m)   Wt 71.7 kg   SpO2 97%   BMI 29.85 kg/m  Physical Exam Vitals and nursing note reviewed.  Constitutional:      Appearance: She is well-developed.  HENT:     Head: Normocephalic and atraumatic.     Mouth/Throat:     Mouth: Mucous membranes are moist.     Pharynx: Oropharynx is clear.  Eyes:     Extraocular Movements: Extraocular movements  intact.     Pupils: Pupils are equal, round, and reactive to light.  Cardiovascular:     Rate and Rhythm: Normal rate and regular rhythm.     Heart sounds: Normal heart sounds.  Pulmonary:     Effort: Pulmonary effort is normal.     Breath sounds: Normal breath sounds.  Abdominal:     General: Abdomen is flat. Bowel sounds are normal.     Palpations: Abdomen is soft.     Tenderness: There is abdominal tenderness in the left lower quadrant.  Skin:    General: Skin is warm and dry.     Capillary Refill: Capillary refill takes less than 2 seconds.  Neurological:     General: No focal deficit present.     Mental Status: She is alert and oriented to person, place, and time.  Psychiatric:        Mood and Affect: Mood normal.        Behavior: Behavior normal.     ED Results / Procedures / Treatments   Labs (all labs ordered are listed, but only abnormal results are displayed) Labs Reviewed  COMPREHENSIVE METABOLIC PANEL - Abnormal; Notable for the following components:      Result Value   CO2 21 (*)    All  other components within normal limits  URINALYSIS, ROUTINE W REFLEX MICROSCOPIC - Abnormal; Notable for the following components:   Ketones, ur 5 (*)    All other components within normal limits  CBC WITH DIFFERENTIAL/PLATELET  LIPASE, BLOOD  I-STAT BETA HCG BLOOD, ED ([facility], [facility], [facility] ONLY)    EKG None  Radiology CT ABDOMEN PELVIS W CONTRAST  Result Date: 01/11/2023 CLINICAL DATA:  Left lower quadrant abdominal pain. EXAM: CT ABDOMEN AND PELVIS WITH CONTRAST TECHNIQUE: Multidetector CT imaging of the abdomen and pelvis was performed using the standard protocol following bolus administration of intravenous contrast. RADIATION DOSE REDUCTION: This exam was performed according to the departmental dose-optimization program which includes automated exposure control, adjustment of the mA and/or kV according to patient size and/or use of iterative reconstruction technique. CONTRAST:   75mL OMNIPAQUE IOHEXOL 350 MG/ML SOLN COMPARISON:  March 09, 2021 FINDINGS: Lower chest: No acute abnormality. Hepatobiliary: No focal liver abnormality is seen. No gallstones, gallbladder wall thickening, or biliary dilatation. Pancreas: Unremarkable. No pancreatic ductal dilatation or surrounding inflammatory changes. Spleen: Normal in size without focal abnormality. Adrenals/Urinary Tract: Adrenal glands are unremarkable. Kidneys are normal, without renal calculi, focal lesion, or hydronephrosis. Bladder is unremarkable. Stomach/Bowel: Stomach is within normal limits. Appendix appears normal. No evidence of bowel wall thickening, distention, or inflammatory changes. Vascular/Lymphatic: Aortic atherosclerosis. No enlarged abdominal or pelvic lymph nodes. Reproductive: The uterus is unremarkable. An 11 mm diameter simple right adnexal cyst is seen. An additional 18 mm diameter simple cyst is noted within the anterior aspect of the left adnexa. Other: A stable ventral hernia versus diastasis is seen. No abdominopelvic ascites. Musculoskeletal: No acute or significant osseous findings. IMPRESSION: 1. No acute or active process within the abdomen or pelvis. 2. Small bilateral simple adnexal cysts, likely ovarian in origin. No follow-up imaging is recommended. This recommendation follows ACR consensus guidelines: White Paper of the ACR Incidental Findings Committee II on Adnexal Findings. J Am Coll Radiol 2013:10:675-681. 3. Stable ventral hernia versus diastasis. 4. Aortic atherosclerosis. Aortic Atherosclerosis (ICD10-I70.0). Electronically Signed   By: Aram Candela M.D.   On: 01/11/2023 23:25    Procedures Procedures    Medications Ordered in ED Medications  morphine (PF) 4 MG/ML injection 4 mg (4 mg Intravenous Given 01/11/23 2213)  sodium chloride 0.9 % bolus 1,000 mL (1,000 mLs Intravenous New Bag/Given 01/11/23 2214)  ondansetron (ZOFRAN) injection 4 mg (4 mg Intravenous Given 01/11/23 2213)  iohexol  (OMNIPAQUE) 350 MG/ML injection 75 mL (75 mLs Intravenous Contrast Given 01/11/23 2309)    ED Course/ Medical Decision Making/ A&P                             Medical Decision Making Risk Prescription drug management.   This patient presents to the ED for concern of abd pain, this involves an extensive number of treatment options, and is a complaint that carries with it a high risk of complications and morbidity.  The differential diagnosis includes sbo, diverticulitis, uti   Co morbidities that complicate the patient evaluation  hx sbo, fibroids, htn, hld, ovarian cyst.   Additional history obtained:  Additional history obtained from epic chart review  Lab Tests:  I Ordered, and personally interpreted labs.  The pertinent results include:  cbc nl, cmp nl, ua + ketones, lip nl, preg neg   Imaging Studies ordered:  I ordered imaging studies including ct abd/pelvis  I independently visualized and interpreted imaging which showed  No acute or active process within the abdomen or pelvis.  2. Small bilateral simple adnexal cysts, likely ovarian in origin.  No follow-up imaging is recommended. This recommendation follows ACR  consensus guidelines: White Paper of the ACR Incidental Findings  Committee II on Adnexal Findings. J Am Coll Radiol 2013:10:675-681.  3. Stable ventral hernia versus diastasis.  4. Aortic atherosclerosis.    Aortic Atherosclerosis (ICD10-I70.0).   I agree with the radiologist interpretation   Cardiac Monitoring:  The patient was maintained on a cardiac monitor.  I personally viewed and interpreted the cardiac monitored which showed an underlying rhythm of: nsr   Medicines ordered and prescription drug management:  I ordered medication including morphine/zofran  for sx  Reevaluation of the patient after these medicines showed that the patient improved I have reviewed the patients home medicines and have made adjustments as needed   Test  Considered:  ct   Critical Interventions:  Pain control  Problem List / ED Course:  Abd pain:  ct is nl.  Labs are nl.  Pt is stable for d/c.  Pt is stable for d/c.  Return if worse.   Reevaluation:  After the interventions noted above, I reevaluated the patient and found that they have :improved   Social Determinants of Health:  Lives at home   Dispostion:  After consideration of the diagnostic results and the patients response to treatment, I feel that the patent would benefit from discharge with outpatient f/u.          Final Clinical Impression(s) / ED Diagnoses Final diagnoses:  Left lower quadrant abdominal pain    Rx / DC Orders ED Discharge Orders          Ordered    HYDROcodone-acetaminophen (NORCO/VICODIN) 5-325 MG tablet  Every 4 hours PRN        01/11/23 2339    ondansetron (ZOFRAN-ODT) 4 MG disintegrating tablet  Every 8 hours PRN        01/11/23 2339              Jacalyn Lefevre, MD 01/11/23 2341

## 2023-01-11 NOTE — ED Triage Notes (Signed)
Pt came in via POV d/t sharp Lt sided abd pain that began at 1545, denies n/v. A/Ox4, rates pain 6/10 while in triage.

## 2023-01-11 NOTE — ED Provider Triage Note (Signed)
Emergency Medicine Provider Triage Evaluation Note  ZORIA RAWLINSON , a 57 y.o. female  was evaluated in triage.  Pt complains of abd pain. LLQ since earlier today.  No n/v/d, no fever, no dysuria.  Hx of SBO, had BM today  Review of Systems  Positive: As above Negative: As above  Physical Exam  BP (!) 156/90 (BP Location: Right Arm)   Pulse (!) 55   Temp 98.6 F (37 C)   Resp 18   Ht  (1.549 m)   Wt 71.7 kg   SpO2 97%   BMI 29.85 kg/m  Gen:   Awake, no distress   Resp:  Normal effort  MSK:   Moves extremities without difficulty  Other:    Medical Decision Making  Medically screening exam initiated at 7:00 PM.  Appropriate orders placed.  Emalea P Mende was informed that the remainder of the evaluation will be completed by another provider, this initial triage assessment does not replace that evaluation, and the importance of remaining in the ED until their evaluation is complete.     Fayrene Helper, PA-C 01/11/23 1904

## 2023-03-05 ENCOUNTER — Other Ambulatory Visit: Payer: Self-pay | Admitting: Family Medicine

## 2023-03-05 DIAGNOSIS — R1013 Epigastric pain: Secondary | ICD-10-CM

## 2023-03-29 ENCOUNTER — Ambulatory Visit
Admission: RE | Admit: 2023-03-29 | Discharge: 2023-03-29 | Disposition: A | Discharge status: Home / Self Care | Payer: BC Managed Care – PPO | Source: Ambulatory Visit | Attending: Family Medicine | Admitting: Family Medicine

## 2023-03-29 DIAGNOSIS — R1013 Epigastric pain: Secondary | ICD-10-CM

## 2023-04-05 ENCOUNTER — Other Ambulatory Visit: Payer: Self-pay | Admitting: Obstetrics and Gynecology

## 2023-04-05 ENCOUNTER — Other Ambulatory Visit (HOSPITAL_COMMUNITY)
Admission: RE | Admit: 2023-04-05 | Discharge: 2023-04-05 | Disposition: A | Discharge status: Home / Self Care | Payer: BC Managed Care – PPO | Source: Ambulatory Visit | Attending: Obstetrics and Gynecology | Admitting: Obstetrics and Gynecology

## 2023-04-05 DIAGNOSIS — Z01419 Encounter for gynecological examination (general) (routine) without abnormal findings: Secondary | ICD-10-CM | POA: Insufficient documentation

## 2023-04-09 LAB — CYTOLOGY - PAP
Comment: NEGATIVE
Diagnosis: NEGATIVE
High risk HPV: NEGATIVE
